# Patient Record
Sex: Female | Born: 1970 | Race: Black or African American | Hispanic: No | Smoking: Current every day smoker
Health system: Southern US, Community
[De-identification: ages and names within clinical notes are randomized; demographics above are authoritative.]

## PROBLEM LIST (undated history)

## (undated) DIAGNOSIS — J302 Other seasonal allergic rhinitis: Secondary | ICD-10-CM

## (undated) DIAGNOSIS — G43909 Migraine, unspecified, not intractable, without status migrainosus: Secondary | ICD-10-CM

## (undated) DIAGNOSIS — K219 Gastro-esophageal reflux disease without esophagitis: Secondary | ICD-10-CM

## (undated) DIAGNOSIS — D649 Anemia, unspecified: Secondary | ICD-10-CM

## (undated) HISTORY — PX: TONSILLECTOMY: SUR1361

## (undated) HISTORY — PX: DILATION AND CURETTAGE OF UTERUS: SHX78

## (undated) HISTORY — PX: CHOLECYSTECTOMY: SHX55

## (undated) HISTORY — DX: Migraine, unspecified, not intractable, without status migrainosus: G43.909

## (undated) HISTORY — PX: TUBAL LIGATION: SHX77

## (undated) HISTORY — PX: WISDOM TOOTH EXTRACTION: SHX21

---

## 2001-10-28 ENCOUNTER — Emergency Department (HOSPITAL_COMMUNITY): Admission: EM | Admit: 2001-10-28 | Discharge: 2001-10-28 | Payer: Self-pay | Admitting: Emergency Medicine

## 2002-07-23 ENCOUNTER — Other Ambulatory Visit: Admission: RE | Admit: 2002-07-23 | Discharge: 2002-07-23 | Payer: Self-pay | Admitting: Family Medicine

## 2002-11-26 ENCOUNTER — Inpatient Hospital Stay (HOSPITAL_COMMUNITY): Admission: AD | Admit: 2002-11-26 | Discharge: 2002-11-26 | Payer: Self-pay | Admitting: Obstetrics and Gynecology

## 2003-03-20 ENCOUNTER — Inpatient Hospital Stay (HOSPITAL_COMMUNITY): Admission: AD | Admit: 2003-03-20 | Discharge: 2003-03-22 | Payer: Self-pay | Admitting: Obstetrics & Gynecology

## 2003-05-15 ENCOUNTER — Encounter: Admission: RE | Admit: 2003-05-15 | Discharge: 2003-05-15 | Payer: Self-pay | Admitting: Family Medicine

## 2003-05-29 ENCOUNTER — Encounter: Admission: RE | Admit: 2003-05-29 | Discharge: 2003-05-29 | Payer: Self-pay | Admitting: Family Medicine

## 2003-07-07 ENCOUNTER — Encounter: Admission: RE | Admit: 2003-07-07 | Discharge: 2003-07-07 | Payer: Self-pay | Admitting: Family Medicine

## 2003-07-16 ENCOUNTER — Ambulatory Visit (HOSPITAL_COMMUNITY): Admission: RE | Admit: 2003-07-16 | Discharge: 2003-07-16 | Payer: Self-pay | Admitting: Obstetrics & Gynecology

## 2004-03-15 ENCOUNTER — Emergency Department (HOSPITAL_COMMUNITY): Admission: EM | Admit: 2004-03-15 | Discharge: 2004-03-15 | Payer: Self-pay | Admitting: Family Medicine

## 2004-04-26 ENCOUNTER — Encounter: Admission: RE | Admit: 2004-04-26 | Discharge: 2004-04-26 | Payer: Self-pay | Admitting: Family Medicine

## 2004-08-20 ENCOUNTER — Ambulatory Visit: Payer: Self-pay | Admitting: Psychiatry

## 2004-11-06 ENCOUNTER — Emergency Department (HOSPITAL_COMMUNITY): Admission: EM | Admit: 2004-11-06 | Discharge: 2004-11-06 | Payer: Self-pay | Admitting: Family Medicine

## 2005-10-06 ENCOUNTER — Emergency Department (HOSPITAL_COMMUNITY): Admission: EM | Admit: 2005-10-06 | Discharge: 2005-10-06 | Payer: Self-pay | Admitting: Emergency Medicine

## 2005-10-09 ENCOUNTER — Emergency Department (HOSPITAL_COMMUNITY): Admission: EM | Admit: 2005-10-09 | Discharge: 2005-10-09 | Payer: Self-pay | Admitting: Emergency Medicine

## 2006-06-20 ENCOUNTER — Emergency Department (HOSPITAL_COMMUNITY): Admission: EM | Admit: 2006-06-20 | Discharge: 2006-06-20 | Payer: Self-pay | Admitting: Family Medicine

## 2006-09-09 ENCOUNTER — Encounter (INDEPENDENT_AMBULATORY_CARE_PROVIDER_SITE_OTHER): Payer: Self-pay | Admitting: *Deleted

## 2006-09-09 LAB — CONVERTED CEMR LAB

## 2006-09-20 ENCOUNTER — Ambulatory Visit: Payer: Self-pay | Admitting: Family Medicine

## 2006-12-07 DIAGNOSIS — E669 Obesity, unspecified: Secondary | ICD-10-CM

## 2006-12-07 DIAGNOSIS — F17200 Nicotine dependence, unspecified, uncomplicated: Secondary | ICD-10-CM

## 2006-12-07 DIAGNOSIS — Z720 Tobacco use: Secondary | ICD-10-CM | POA: Insufficient documentation

## 2006-12-08 ENCOUNTER — Encounter (INDEPENDENT_AMBULATORY_CARE_PROVIDER_SITE_OTHER): Payer: Self-pay | Admitting: *Deleted

## 2007-06-18 ENCOUNTER — Encounter (INDEPENDENT_AMBULATORY_CARE_PROVIDER_SITE_OTHER): Payer: Self-pay | Admitting: *Deleted

## 2007-07-22 ENCOUNTER — Emergency Department (HOSPITAL_COMMUNITY): Admission: EM | Admit: 2007-07-22 | Discharge: 2007-07-22 | Payer: Self-pay | Admitting: Family Medicine

## 2007-09-03 ENCOUNTER — Emergency Department (HOSPITAL_COMMUNITY): Admission: EM | Admit: 2007-09-03 | Discharge: 2007-09-03 | Payer: Self-pay | Admitting: Family Medicine

## 2008-01-02 ENCOUNTER — Telehealth: Payer: Self-pay | Admitting: *Deleted

## 2008-01-03 ENCOUNTER — Encounter (INDEPENDENT_AMBULATORY_CARE_PROVIDER_SITE_OTHER): Payer: Self-pay | Admitting: Family Medicine

## 2008-01-03 ENCOUNTER — Encounter: Payer: Self-pay | Admitting: Family Medicine

## 2008-01-03 ENCOUNTER — Ambulatory Visit: Payer: Self-pay | Admitting: Family Medicine

## 2008-01-03 LAB — CONVERTED CEMR LAB
Chlamydia, DNA Probe: NEGATIVE
GC Probe Amp, Genital: NEGATIVE
HCV Ab: NEGATIVE
Hep A IgM: NEGATIVE
Hep B C IgM: NEGATIVE
Hepatitis B Surface Ag: NEGATIVE
Pap Smear: NORMAL

## 2008-01-07 ENCOUNTER — Encounter: Payer: Self-pay | Admitting: Family Medicine

## 2008-01-07 ENCOUNTER — Encounter (INDEPENDENT_AMBULATORY_CARE_PROVIDER_SITE_OTHER): Payer: Self-pay | Admitting: Family Medicine

## 2008-04-08 ENCOUNTER — Encounter: Admission: RE | Admit: 2008-04-08 | Discharge: 2008-04-08 | Payer: Self-pay | Admitting: Family Medicine

## 2008-07-21 ENCOUNTER — Emergency Department (HOSPITAL_COMMUNITY): Admission: EM | Admit: 2008-07-21 | Discharge: 2008-07-21 | Payer: Self-pay | Admitting: Emergency Medicine

## 2009-01-21 ENCOUNTER — Ambulatory Visit: Payer: Self-pay | Admitting: Family Medicine

## 2009-01-21 ENCOUNTER — Telehealth: Payer: Self-pay | Admitting: Family Medicine

## 2009-01-21 DIAGNOSIS — J301 Allergic rhinitis due to pollen: Secondary | ICD-10-CM

## 2009-04-21 ENCOUNTER — Encounter: Payer: Self-pay | Admitting: Family Medicine

## 2010-03-15 ENCOUNTER — Encounter: Payer: Self-pay | Admitting: *Deleted

## 2010-03-26 ENCOUNTER — Encounter: Payer: Self-pay | Admitting: Family Medicine

## 2010-03-28 ENCOUNTER — Emergency Department (HOSPITAL_COMMUNITY): Admission: EM | Admit: 2010-03-28 | Discharge: 2010-03-28 | Payer: Self-pay | Admitting: Emergency Medicine

## 2010-03-29 ENCOUNTER — Ambulatory Visit: Payer: Self-pay | Admitting: Family Medicine

## 2010-09-15 ENCOUNTER — Ambulatory Visit: Payer: Self-pay | Admitting: Family Medicine

## 2010-09-15 DIAGNOSIS — A5901 Trichomonal vulvovaginitis: Secondary | ICD-10-CM

## 2010-09-15 DIAGNOSIS — N898 Other specified noninflammatory disorders of vagina: Secondary | ICD-10-CM | POA: Insufficient documentation

## 2010-09-15 DIAGNOSIS — N760 Acute vaginitis: Secondary | ICD-10-CM | POA: Insufficient documentation

## 2010-09-15 DIAGNOSIS — B373 Candidiasis of vulva and vagina: Secondary | ICD-10-CM

## 2010-09-15 LAB — CONVERTED CEMR LAB: Whiff Test: POSITIVE

## 2010-10-26 ENCOUNTER — Ambulatory Visit
Admission: RE | Admit: 2010-10-26 | Discharge: 2010-10-26 | Payer: Self-pay | Source: Home / Self Care | Attending: Family Medicine | Admitting: Family Medicine

## 2010-10-26 ENCOUNTER — Encounter: Payer: Self-pay | Admitting: Family Medicine

## 2010-10-26 DIAGNOSIS — G43909 Migraine, unspecified, not intractable, without status migrainosus: Secondary | ICD-10-CM | POA: Insufficient documentation

## 2010-11-09 NOTE — Assessment & Plan Note (Signed)
Summary: sinus problem,tcb   Vital Signs:  Patient profile:   40 year old female Weight:      261 pounds Temp:     98.0 degrees F oral Pulse rate:   88 / minute BP sitting:   107 / 71  (left arm) Cuff size:   regular  Vitals Entered By: Tessie Fass CMA (March 29, 2010 3:43 PM) CC: sinus infection Is Patient Diabetic? No Pain Assessment Patient in pain? no        Primary Care Provider:  Lequita Asal  MD  CC:  sinus infection.  History of Present Illness: Really sick last week, not getting better.  Moslty sinus congestion and coughing most of the night.  Smokes 1 ppd for 20 years.  Does not want to miss work.  Habits & Providers  Alcohol-Tobacco-Diet     Tobacco Status: current     Tobacco Counseling: to quit use of tobacco products     Cigarette Packs/Day: 1.0  Current Medications (verified): 1)  Doxycycline Hyclate 100 Mg Caps (Doxycycline Hyclate) .... One Tab Two Times A Day For One Week 2)  Hydromet 5-1.5 Mg/46ml Syrp (Hydrocodone-Homatropine) .... One Teaspoonful Three Times A Day As Needed Cough, 120 Cc  Allergies (verified): No Known Drug Allergies  Review of Systems General:  Denies fever. ENT:  Complains of earache, nasal congestion, postnasal drainage, and sinus pressure. Resp:  Complains of cough, sputum productive, and wheezing.  Physical Exam  General:  Obese, congested Ears:  red TMs, right more retracted than left Nose:  swollen red turbs, moving little air through nares Mouth:  small oral pharynx, swollen tissue, + thick pnd Lungs:  course rhonchi, frequent cough Heart:  normal rate and regular rhythm.     Impression & Recommendations:  Problem # 1:  SINUSITIS, ACUTE (ICD-461.9)  Her updated medication list for this problem includes:    Doxycycline Hyclate 100 Mg Caps (Doxycycline hyclate) ..... One tab two times a day for one week    Hydromet 5-1.5 Mg/35ml Syrp (Hydrocodone-homatropine) ..... One teaspoonful three times a day as  needed cough, 120 cc  Orders: [facility]- Est Level  3 (95621)  Problem # 2:  TOBACCO DEPENDENCE (ICD-305.1) discussed risks of smoking and role in chronic sinus problems  Complete Medication List: 1)  Doxycycline Hyclate 100 Mg Caps (Doxycycline hyclate) .... One tab two times a day for one week 2)  Hydromet 5-1.5 Mg/64ml Syrp (Hydrocodone-homatropine) .... One teaspoonful three times a day as needed cough, 120 cc  Patient Instructions: 1)  Tobacco is very bad for your health and your loved ones ! You should stop smoking !  2)  Stop smoking tips: Choose a quit date. Cut down before the quit date. Decide what you will do as a substitute when you feel the urge to smoke(gum, toothpick, exercise).  Prescriptions: HYDROMET 5-1.5 MG/5ML SYRP (HYDROCODONE-HOMATROPINE) one teaspoonful three times a day as needed cough, 120 cc Brand medically necessary #1 x 0   Entered and Authorized by:   Luretha Murphy NP   Signed by:   Luretha Murphy NP on 03/29/2010   Method used:   Print then Give to Patient   RxID:   [id number] DOXYCYCLINE HYCLATE 100 MG CAPS (DOXYCYCLINE HYCLATE) one tab two times a day for one week Brand medically necessary #14 x 0   Entered and Authorized by:   Luretha Murphy NP   Signed by:   Luretha Murphy NP on 03/29/2010   Method used:   Print then Give  to Patient   RxID:   [id number]

## 2010-11-09 NOTE — Assessment & Plan Note (Signed)
Summary: yeast infection/eo   Vital Signs:  Patient profile:   40 year old female Weight:      260 pounds Temp:     98.4 degrees F oral Pulse rate:   87 / minute Pulse rhythm:   regular BP sitting:   125 / 95  (left arm) Cuff size:   regular  Vitals Entered By: Loralee Pacas CMA (September 15, 2010 8:39 AM) CC: yeast inf   Primary Care Provider:  Lequita Asal  MD  CC:  yeast inf.  History of Present Illness: 1. ? yeast infection: - Pt presents as a work in with complaints of vaginal itching and discharge and is concerned that she may have a yeast infection - She has had a yeast infection before many years ago and this does feel similar to that - She is only have mild vaginal discharge.  It is described as a white discharge, without a foul smell - She has had this for about 1 week.  It got a little better and then got worse. - She hasn't been taking anything for this  ROS: denies vaginal bleeding, dysuria, urinary frequency, abdominal pain, back pain, fevers, chills  SocHx: Is not sexually active.  Last intercourse was back in October.  Current Medications (verified): 1)  Hydromet 5-1.5 Mg/18ml Syrp (Hydrocodone-Homatropine) .... One Teaspoonful Three Times A Day As Needed Cough, 120 Cc 2)  Metronidazole 500 Mg Tabs (Metronidazole) .Marland Kitchen.. 1 Tab By Mouth Twice A Day For 7 Days 3)  Diflucan 150 Mg Tabs (Fluconazole) .... Take 1 Tab By Mouth X 1  Allergies (verified): No Known Drug Allergies  Past History:  Past Medical History: Reviewed history from 12/07/2006 and no changes required. trichomoniasis 09/2006  Social History: Reviewed history from 12/07/2006 and no changes required. Separated from husband. Has two daughters.  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  No illicits.  Smokes 1 ppd.  Physical Exam  General:  Vitals reviewed.  Comfortable appeaing.  no acute distress Mouth:  no oral lesions Neck:  supple, full ROM, and no masses.   Lungs:  normal  respiratory effort.   Heart:  normal rate and regular rhythm.   Abdomen:  soft, non-tender, normal bowel sounds, no distention, no masses, no guarding, no rigidity, and no rebound tenderness.   Genitalia:  minimal white vaginal discharge.  normal introitus, no external lesions, no vaginal or cervical lesions, no vaginal atrophy, no friaility or hemorrhage, and no adnexal masses or tenderness.   Msk:  no joint tenderness and no joint swelling.   Skin:  no rashes and no suspicious lesions.   Psych:  not anxious appearing and not depressed appearing.     Impression & Recommendations:  Problem # 1:  TRICHOMONAL VULVOVAGINITIS (ICD-131.01) Assessment New  Treat with Flagyl  Orders: [facility]- Est  Level 4 ([address])  Problem # 2:  BACTERIAL VAGINITIS (ICD-616.10) Assessment: New  Treat with Flagyl The following medications were removed from the medication list:    Doxycycline Hyclate 100 Mg Caps (Doxycycline hyclate) ..... One tab two times a day for one week Her updated medication list for this problem includes:    Metronidazole 500 Mg Tabs (Metronidazole) .Marland Kitchen... 1 tab by mouth twice a day for 7 days  Orders: [facility]- Est  Level 4 ([address])  Problem # 3:  CANDIDIASIS, VAGINAL (ICD-112.1) Assessment: New  Treat with Diflucan Her updated medication list for this problem includes:    Diflucan 150 Mg Tabs (Fluconazole) .Marland Kitchen... Take 1  tab by mouth x 1  Orders: [facility]- Est  Level 4 ([address])  Complete Medication List: 1)  Hydromet 5-1.5 Mg/20ml Syrp (Hydrocodone-homatropine) .... One teaspoonful three times a day as needed cough, 120 cc 2)  Metronidazole 500 Mg Tabs (Metronidazole) .Marland Kitchen.. 1 tab by mouth twice a day for 7 days 3)  Diflucan 150 Mg Tabs (Fluconazole) .... Take 1 tab by mouth x 1  [facility]  Patient Instructions: 1)  You have a couple different infections 2)  You have BV, Trich, and a yeast infection 3)  I am going to treat you for all of them 4)  I have sent  in prescriptions to your pharmacy 5)  If not better in 7-10 days please return to clinic Prescriptions: DIFLUCAN 150 MG TABS (FLUCONAZOLE) Take 1 tab by mouth x 1  #1 x 0   Entered and Authorized by:   Angelena Sole MD   Signed by:   Angelena Sole MD on 09/15/2010   Method used:   Electronically to        [facility] [id number] (retail)       [address]       [place], [place]  [address]       Ph: [phone]       Fax: [phone]   RxID:   [id number] METRONIDAZOLE 500 MG TABS (METRONIDAZOLE) 1 tab by mouth twice a day for 7 days  #14 x 0   Entered and Authorized by:   Angelena Sole MD   Signed by:   Angelena Sole MD on 09/15/2010   Method used:   Electronically to        [facility] [address] (retail)       [address]       [place], [place]  [address]       Ph: [phone]       Fax: [phone]   RxID:   [id number]    Orders Added: 1)  Wet Prep- [facility] [20254] 2)  [facility]- Est  Level 4 [[address]]    Laboratory Results  Date/Time Received: September 15, 2010 9:00 AM  Date/Time Reported: September 15, 2010 9:12 AM   Wet [place] Source: vag WBC/hpf: 5-10 Bacteria/hpf: 3+ rods and cocci Clue cells/hpf: moderate  Positive whiff Yeast/hpf: occ Trichomonas/hpf: many Comments: ...............test performed by......Marland KitchenBonnie A. [place], MLS (ASCP)cm

## 2010-11-09 NOTE — Letter (Signed)
Summary: Generic Letter  [facility]  [address]   [place], [place] [address]   Phone: [phone]  Fax: [phone]    03/15/2010  [name] [address] [place], [place]  [address]  Dear [name],   this letter is to inform you of the appointment that has been made for Erika Mckenzie for March 24, 2010 @ 1015am with Dr. Pollyann Kennedy at [facility] [address] phone [phone].  If you cannot keep this appointment please give thier office a 24 hour advanced notice.        Sincerely,   Loralee Pacas CMA

## 2010-11-09 NOTE — Miscellaneous (Signed)
  Clinical Lists Changes  Problems: Removed problem of SEXUALLY TRANSMITTED DISEASE, EXPOSURE TO (ICD-V01.6) Removed problem of GYNECOLOGICAL EXAMINATIONOUTINE (ICD-V72.31) Removed problem of SCREENING FOR MALIGNANT NEOPLASM(ICD-V76.2) Medications: Removed medication of GFN 1200/DM 60 1200-60 MG XR12H-TAB (DEXTROMETHORPHAN-GUAIFENESIN) one tablet by mouth two times a day as needed for cough

## 2010-11-11 NOTE — Letter (Signed)
Summary: Out of Work  [facility]  [address]   [place], [place] [address]   Phone: [phone]  Fax: [phone]    October 26, 2010   Employee:  Blondie C Kinsel    To Whom It May Concern:   For Medical reasons, please excuse the above named employee from work for the following dates:  Start:   10/26/2010  End:   10/26/2010  If you need additional information, please feel free to contact our office.         Sincerely,    Majel Homer MD

## 2010-11-11 NOTE — Assessment & Plan Note (Signed)
Summary: sinus inf?,df   Vital Signs:  Patient profile:   40 year old female Weight:      265.7 pounds Temp:     98.4 degrees F oral Pulse rate:   76 / minute BP sitting:   123 / 80  (left arm) Cuff size:   large  Vitals Entered By: Garen Grams LPN (October 26, 2010 10:05 AM) CC: ? sinus infection x 5 days, Headache Is Patient Diabetic? No Pain Assessment Patient in pain? yes     Location: head/ears   Primary Provider:  . [facility]  CC:  ? sinus infection x 5 days and Headache.  History of Present Illness: Pt presents with headache of 4 days duration.  Pain is bilateral forehead, persistent, sharp, and exacerbated by touch or pressure.  It is severe enough to interfear with her sleep and is accompanied by photo and phonophobia.  She is reducing her activity and has had to miss work due to it.  Pt reports waking up with the headache 4 days ago and she says it has gradually worsened since then.  She has had nausea and vomiting one time, no diarrhea.  no nasal drainage or changes in vision or hearing.  She does report that she typically has problems with headaches associated with her periods but that they are never this bad.  She had tried tylenol, excederine and goody powder all to no effect.  Preventive Screening-Counseling & Management  Alcohol-Tobacco     Smoking Status: current     Smoking Cessation Counseling: YES     Packs/Day: 1.0     Tobacco Counseling: to quit use of tobacco products  Allergies: No Known Drug Allergies  Past History:  Past medical, surgical, family and social histories (including risk factors) reviewed for relevance to current acute and chronic problems.  Past Medical History: Reviewed history from 12/07/2006 and no changes required. trichomoniasis 09/2006  Past Surgical History: Reviewed history from 12/07/2006 and no changes required. Lipid panel: TC=145, TG=98, HDL=58,LDL=67 - 09/21/2006  Family History: Reviewed history from  12/07/2006 and no changes required. father - ???, mother - DM, younger sister - healthy  Social History: Reviewed history from 12/07/2006 and no changes required. Separated from husband. Has two daughters.  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  No illicits.  Smokes 1 ppd.  Review of Systems       The patient complains of anorexia and headaches.  The patient denies fever, weight loss, weight gain, vision loss, decreased hearing, hoarseness, chest pain, syncope, dyspnea on exertion, peripheral edema, prolonged cough, hemoptysis, abdominal pain, melena, hematochezia, severe indigestion/heartburn, hematuria, transient blindness, and difficulty walking.    Physical Exam  General:  Vitals reviewed.  Comfortable appeaing.  no acute distress Head:  normocephalic and atraumatic.  Pain on palpation in preauricular portion of face and forehead bilaterally.  No tenderness over maxillary sinuses. Eyes:  vision grossly intact, pupils equal, pupils round, pupils reactive to light, and no injection.   Ears:  R ear normal and L ear normal.   Nose:  no external deformity, no external erythema, and no nasal discharge.   Mouth:  good dentition, pharynx pink and moist, no erythema, and no exudates.   Lungs:  normal respiratory effort.  CTABL Heart:  normal rate and regular rhythm.   Abdomen:  soft, non-tender, and normal bowel sounds.   Extremities:  No extremity swelling   Impression & Recommendations:  Problem # 1:  MIGRAINE HEADACHE (ICD-346.90) Pt has  what would appear to be menstrual associated migraines and now has a non-associated migraine.  Has not received any treatment for migraines in the past.  Toradol injection (30mg ) now with a compazine prescription and instruction to take benadryl throughout the day.  Come back tomorrow if not feeling better.  Will likely need to try immitrex at that point.  Orders: Ketorolac-Toradol 15mg  (Z6109) [facility]- Est Level  3 ([address])  Complete Medication  List: 1)  Prochlorperazine Maleate 10 Mg Tabs (Prochlorperazine maleate) .... Take one tablet every six hours for your headache   Patient Instructions: 1)  It was great to see you today.  I am very sorry to hear that you have such a bad headache. 2)  I will send in a prescription for Compazine.  You will want to take that (10mg  every six hours) and benadryl (also every six hours) for the rest of the day and try to get some sleep. 3)  If you are not feeling better tomorrow, come back in to see [place] tomorrow and we will try some other medicines. Prescriptions: PROCHLORPERAZINE MALEATE 10 MG TABS (PROCHLORPERAZINE MALEATE) Take one tablet every six hours for your headache  #30 x 0   Entered and Authorized by:   Majel Homer MD   Signed by:   Majel Homer MD on 10/26/2010   Method used:   Electronically to        [facility] [address] (retail)       [address] [place], [place]  [address]       Ph: [phone]       Fax: [phone]   RxID:   [id number]    Medication Administration  Injection # 1:    Medication: Ketorolac-Toradol 15mg     Diagnosis: MIGRAINE HEADACHE (ICD-346.90)    Route: IM    Site: L deltoid    Exp Date: 01/09/2012    Lot #: [id number]    Mfr: [email or web address]    Comments: Patient recieved 30 mg of Toradol    Patient tolerated injection without complications    Given by: Garen Grams LPN (October 26, 2010 11:21 AM)  Orders Added: 1)  Ketorolac-Toradol 15mg  [J1885] 2)  [facility]- Est Level  3 [44034]     Medication Administration  Injection # 1:    Medication: Ketorolac-Toradol 15mg     Diagnosis: MIGRAINE HEADACHE (ICD-346.90)    Route: IM    Site: L deltoid    Exp Date: 01/09/2012    Lot #: [id number]    Mfr: Wockhardt    Comments: Patient recieved 30 mg of Toradol    Patient tolerated injection without complications    Given by: Garen Grams LPN (October 26, 2010 11:21 AM)  Orders Added: 1)  Ketorolac-Toradol 15mg  [J1885] 2)   [facility]- Est Level  3 [[address]]

## 2010-11-26 ENCOUNTER — Encounter: Payer: Self-pay | Admitting: *Deleted

## 2011-01-28 ENCOUNTER — Ambulatory Visit (INDEPENDENT_AMBULATORY_CARE_PROVIDER_SITE_OTHER): Payer: Self-pay | Admitting: Family Medicine

## 2011-01-28 ENCOUNTER — Telehealth: Payer: Self-pay | Admitting: Family Medicine

## 2011-01-28 VITALS — BP 117/80 | HR 118 | Temp 99.5°F | Ht 64.0 in | Wt 258.3 lb

## 2011-01-28 DIAGNOSIS — J111 Influenza due to unidentified influenza virus with other respiratory manifestations: Secondary | ICD-10-CM | POA: Insufficient documentation

## 2011-01-28 MED ORDER — BENZONATATE 200 MG PO CAPS: 200.0000 mg | ORAL_CAPSULE | Freq: Three times a day (TID) | ORAL | Status: AC | PRN

## 2011-01-28 MED ORDER — OSELTAMIVIR PHOSPHATE 75 MG PO CAPS: 75.0000 mg | ORAL_CAPSULE | Freq: Two times a day (BID) | ORAL | Status: AC

## 2011-01-28 NOTE — Progress Notes (Signed)
URI: Pt has not been feeling well since Monday but says she was not having body aches and pains and fevers and cough until Wednesday (yesterday). She is now feeling even worse and missed worked yesterday and today. She has a cough but no sore throat, no runny nose, no breathing problems, no currently sick contacts but has some kids that were sick last week. She has been using home tea with lemon and honey, and Nyquil. She is having fevers and chills.   ROs: neg except as noted in HPi.   PE: Gen: NAD, but pt does not appear to feel well.  HEENT: [place]/AT, PERRL, EOMI, TM's normal and canals normal bilaterally, minor swelling on nasal turbinates, no obstruction. Post-pharynx has minimal erythema on anterior pillars.  CV: tachycardia, no murmur Pulm: CTAB, pt does cough frequently, non-productive.

## 2011-01-28 NOTE — Patient Instructions (Signed)
I agree that you sound like you have the flu.  I have sent in a medicine for cough and the Tamilflu. You can also try Robitussin DM which is OTC for cough and congestion if you need it.  Drink lots of water and rest.

## 2011-01-28 NOTE — Telephone Encounter (Signed)
rx for tamiflu prescribed this morning is over $100, pt is uninsured, wants to know if something cheaper can be called in?

## 2011-01-28 NOTE — Assessment & Plan Note (Signed)
Pt has had body aches and fevers since yesterday. Plan to treat with Tamiflu and tessalon perrles for the cough.

## 2011-01-31 NOTE — Telephone Encounter (Signed)
Spoke with patient and informed.

## 2011-01-31 NOTE — Telephone Encounter (Signed)
No , this is the only approved anti-flu medicine. She will need to just rest and drink lots of fluids and warm herbal tea and wait for time to heal it. She could consider getting the flu shot when she gets better. I'm sorry there is nothing more to offer but it is a virus. Please let her know.  Hospital doctor

## 2011-02-25 NOTE — Op Note (Signed)
   NAME:  Erika Mckenzie, Erika Mckenzie                        ACCOUNT NO.:  [id number]   MEDICAL RECORD NO.:  [id number]                   PATIENT TYPE:  AMB   LOCATION:  [facility]                                  FACILITY:  [facility]   PHYSICIAN:  Kathreen Cosier, M.D.           DATE OF BIRTH:  1970/11/15   DATE OF PROCEDURE:  07/16/2003  DATE OF DISCHARGE:                                 OPERATIVE REPORT   PREOPERATIVE DIAGNOSIS:  Multiparity.   PROCEDURE:  Open laparoscopic tubal sterilization.   Under general anesthesia, patient in lithotomy position, abdomen, perineum,  and vagina prepped and draped, bladder emptied with a straight catheter.  The weighted speculum placed in the vagina, then the cervix was grasped with  a Hulka tenaculum.  In the umbilicus a transverse incision made and carried  down to the fascia.  The fascia was cleaned, grasped with two Kochers, and  the fascia and the peritoneum opened with the Mayo scissors after elevating  the abdominal wall.  The sleeve of the trocar was inserted  intraperitoneally.  Three liters of carbon dioxide infused  intraperitoneally.  Visualizing scope inserted.  The uterus, tubes, and  ovaries were normal.  The cautery probe inserted through the sleeve of the  scope, the right tube grasped 1 cm from the cornu and cauterized.  The tube  was cauterized in a total of four places, moving lateral to the first site  of cautery.  The procedure done in a similar fashion on the other side.  The  probe was removed, CO2 allowed to escape from the peritoneal cavity.  The  fascia closed with a running stitch of 0 Dexon and the skin closed with  subcuticular stitch of 3-0 Monocryl.  The patient tolerated the procedure  well, taken to the recovery room in good condition.                                               Kathreen Cosier, M.D.    BAM/MEDQ  D:  07/16/2003  T:  07/16/2003  Job:  [id number]

## 2011-04-26 ENCOUNTER — Encounter: Payer: Self-pay | Admitting: Family Medicine

## 2011-04-26 ENCOUNTER — Ambulatory Visit (INDEPENDENT_AMBULATORY_CARE_PROVIDER_SITE_OTHER): Payer: 59 | Admitting: Family Medicine

## 2011-04-26 VITALS — BP 124/90 | HR 70 | Temp 98.4°F | Ht 64.0 in | Wt 260.8 lb

## 2011-04-26 DIAGNOSIS — G43909 Migraine, unspecified, not intractable, without status migrainosus: Secondary | ICD-10-CM

## 2011-04-26 MED ORDER — NAPROXEN 500 MG PO TABS: 500.0000 mg | ORAL_TABLET | Freq: Two times a day (BID) | ORAL | Status: DC

## 2011-04-26 NOTE — Progress Notes (Signed)
  Subjective:    Patient ID: Erika Mckenzie, female    DOB: [birth year], 40 y.o.   MRN: [id number]  HPIHere for work in appt for migraine  Migraines:  Occurs 5-6 times per year.  Usually related to sometime around her menstrual cycle.  Lasts usually 1 week.  Says a doctor has given her a prescription before but ran out so takes Exedrin OTC 2 tablets 2-3 times per day during that week, then does not have to take medications during other times of the month.  Has never been on prophylaxis.  Cannot find name of medication in [employer] or Epic.  This occurrence has been past 2-3 days, consistent with previous migraines throbbing pain, frontal, with photophobia, phonophobia.  No aura.  No changes in vision.  Some nausea. No vomiting.  No numbness, tingling, weakness.  I have reviewed patient's  PMH, FH, and Social history and Medications as related to this visit.  Review of Systems See HPI    Objective:   Physical Exam GEN: Alert & Oriented, No acute distress CV:  Regular Rate & Rhythm, no murmur Respiratory:  Normal work of breathing, CTAB Abd:  + BS, soft, no tenderness to palpation Ext: no pre-tibial edema Neuro:  CN 2-12 grossly intact.  Reflexes 2+ bilaterally.  Fundoscopic exam normal.        Assessment & Plan:

## 2011-04-26 NOTE — Assessment & Plan Note (Addendum)
No red flags.  Given sumatriptan today in office, given prescription for naprosyn for pain control.  Given handout on lifestyle modification to reduce headaches.  Discussed options for reducing menstrual migraines- not a candidate for estrogen containing therapies and has no need for contraception (BTL).  Advised to pretreat with naprosyn 1-2 days prior to menses.  If worsens, may consider progestin only tx to reduce number of menses.

## 2011-04-26 NOTE — Patient Instructions (Signed)
Your got a medicine called sumatriptan (imitrex) here in the office.   Use naprosyn for your headache. Consider using naprosyn 1-2 days before your cycles to help prevent migraines, or right at start of signo f headache. See information on non-medicine things you can do to prevent migraines.  Make appointment for check-up (physical)  Migraine Headache A migraine is very bad pain on one or both sides of your head. The cause of a migraine is not always known. HOME CARE  Many medicines can help migraine pain or keep migraines from coming back. Your doctor can help you decide on a medicine or treatment program.   If you or your child gets a migraine, it may help to lie down in a dark, quiet room.   Keep a headache journal. This may help find out what is causing the headaches. For example, write down:   What you eat and drink.   How much sleep you get.   Any change to your diet or medicines.  MIGRAINE SYMPTOMS  Sometimes, an aura can occur before you get a migraine. An aura is a group of problems (symptoms) that can predict a migraine. These can include:   Seeing flashing lights, bright spots, or zig-zag lines.   Tunnel vision.   Trouble talking.   Feelings of numbness.   Muscle weakness.   A migraine includes one or more of the following problems:   Pain on one or both sides of the head.   Pain that feels like pounding or throbbing inside the head.   Pain that is bad enough to keep you from doing daily activities.   Feeling sick to your stomach (nauseous).   Throwing up (vomiting).   Pain with exposure to bright lights, loud noises, or activity.  MIGRAINE TRIGGERS A migraine can be "triggered" or caused by different things, such as:  Alcohol.   Smoking.    Stress.    Your period (female menstruation) may be related.     Aged cheeses.     Foods or drinks that contain nitrates, glutamate, aspartame, or tyramine.     Lack of sleep.   Chocolate.    Caffeine.     Hunger.    Medicines, such as nitroglycerine (used to treat chest pain), birth control pills, estrogen, and some blood pressure medicines.     DIAGNOSIS  A migraine headache is often diagnosed based on:   Symptoms.   Physical exam.  GET HELP RIGHT AWAY IF:  The medicine you or your child was given does not work.   The pain begins again.   The neck is stiff.   You or your child is having trouble seeing.   The muscles are weak or you or your child loses muscle control.   There are new, very bad symptoms.   You or your child loses balance.   You or your child has trouble walking.   You or your child feels faint or passes out.  MAKE SURE YOU:    Understand these instructions.   Will watch this condition.   Will get help right away if you or your child is not doing well or gets worse.  Document Released: 07/05/2008 Document Re-Released: 12/21/2009 [facility] Patient Information 2011 [place], [place].

## 2011-05-27 ENCOUNTER — Ambulatory Visit (INDEPENDENT_AMBULATORY_CARE_PROVIDER_SITE_OTHER): Payer: 59 | Admitting: Family Medicine

## 2011-05-27 ENCOUNTER — Encounter: Payer: Self-pay | Admitting: Family Medicine

## 2011-05-27 VITALS — BP 124/78 | HR 80 | Temp 98.1°F | Wt 259.0 lb

## 2011-05-27 DIAGNOSIS — M5380 Other specified dorsopathies, site unspecified: Secondary | ICD-10-CM

## 2011-05-27 DIAGNOSIS — M62830 Muscle spasm of back: Secondary | ICD-10-CM | POA: Insufficient documentation

## 2011-05-27 MED ORDER — CYCLOBENZAPRINE HCL 5 MG PO TABS: 5.0000 mg | ORAL_TABLET | Freq: Every evening | ORAL | Status: AC | PRN

## 2011-05-27 NOTE — Patient Instructions (Signed)
I think you have some muscle spasm I have sent a muscle relaxant to your pharmacy. Take it at night because it can make you drowsy.  Take aleve with food twice a day for 3 days. Use heat for 15 mins three times a day  Come back if you have worsening pain or weakness

## 2011-05-27 NOTE — Assessment & Plan Note (Signed)
No loss of ROM, no weakness.  Likely 2/2 overuse.  Will have her try flexeril and continue aleve for 3 days.  Asked her to RTC if worsening or any weakness.

## 2011-05-27 NOTE — Progress Notes (Signed)
  Subjective:    Patient ID: Erika Mckenzie, female    DOB: [birth year], 40 y.o.   MRN: [id number]  HPI Pt was sweeping yard waste after doing yard work on Tuesday of this week.  She felt fine, she did not have any injury or sudden pain.  The next morning she woke up feeling sore and her back and shoulders hurt.  She was able to go to work but felt even worse on Thursday and took the day off.  Aleve helped but this morning she only feels maybe 5% better overall.  She denies numbness, tingling, weakness.  Review of Systems Denies CP, SOB, HA, N/V/D, fever     Objective:   Physical Exam Vital signs reviewed General appearance - alert, well appearing, mild discomfort with movement and oriented to person, place, and time MSK- full ROM of motion in spine and shoulders bilaterally.  Tender diffusely across cervical muscles, deltoid, and paraspinus muscles in the thoracic area.  Full strength in arms and hands       Assessment & Plan:  Muscle spasm of back No loss of ROM, no weakness.  Likely 2/2 overuse.  Will have her try flexeril and continue aleve for 3 days.  Asked her to RTC if worsening or any weakness.

## 2011-07-19 LAB — POCT URINALYSIS DIP (DEVICE)
Bilirubin Urine: NEGATIVE
Glucose, UA: NEGATIVE
Ketones, ur: NEGATIVE
Nitrite: NEGATIVE
Operator id: 235561
Protein, ur: NEGATIVE
Specific Gravity, Urine: 1.02
Urobilinogen, UA: 0.2
pH: 6.5

## 2011-07-19 LAB — WET PREP, GENITAL
Trich, Wet Prep: NONE SEEN
Yeast Wet Prep HPF POC: NONE SEEN

## 2011-07-19 LAB — HEPATITIS B SURFACE ANTIGEN: Hepatitis B Surface Ag: NEGATIVE

## 2011-07-19 LAB — RPR: RPR Ser Ql: NONREACTIVE

## 2011-07-19 LAB — GC/CHLAMYDIA PROBE AMP, GENITAL
Chlamydia, DNA Probe: NEGATIVE
GC Probe Amp, Genital: NEGATIVE

## 2011-07-19 LAB — HIV ANTIBODY (ROUTINE TESTING W REFLEX): HIV: NONREACTIVE

## 2011-07-20 IMAGING — CR DG CHEST 2V
2 series · 2 of 2 positions shown · non-contrast
Comparison: 04/08/2008.

CLINICAL DATA: Cough with chest pain and weakness.

CHEST - 2 VIEW

[w chest pa]
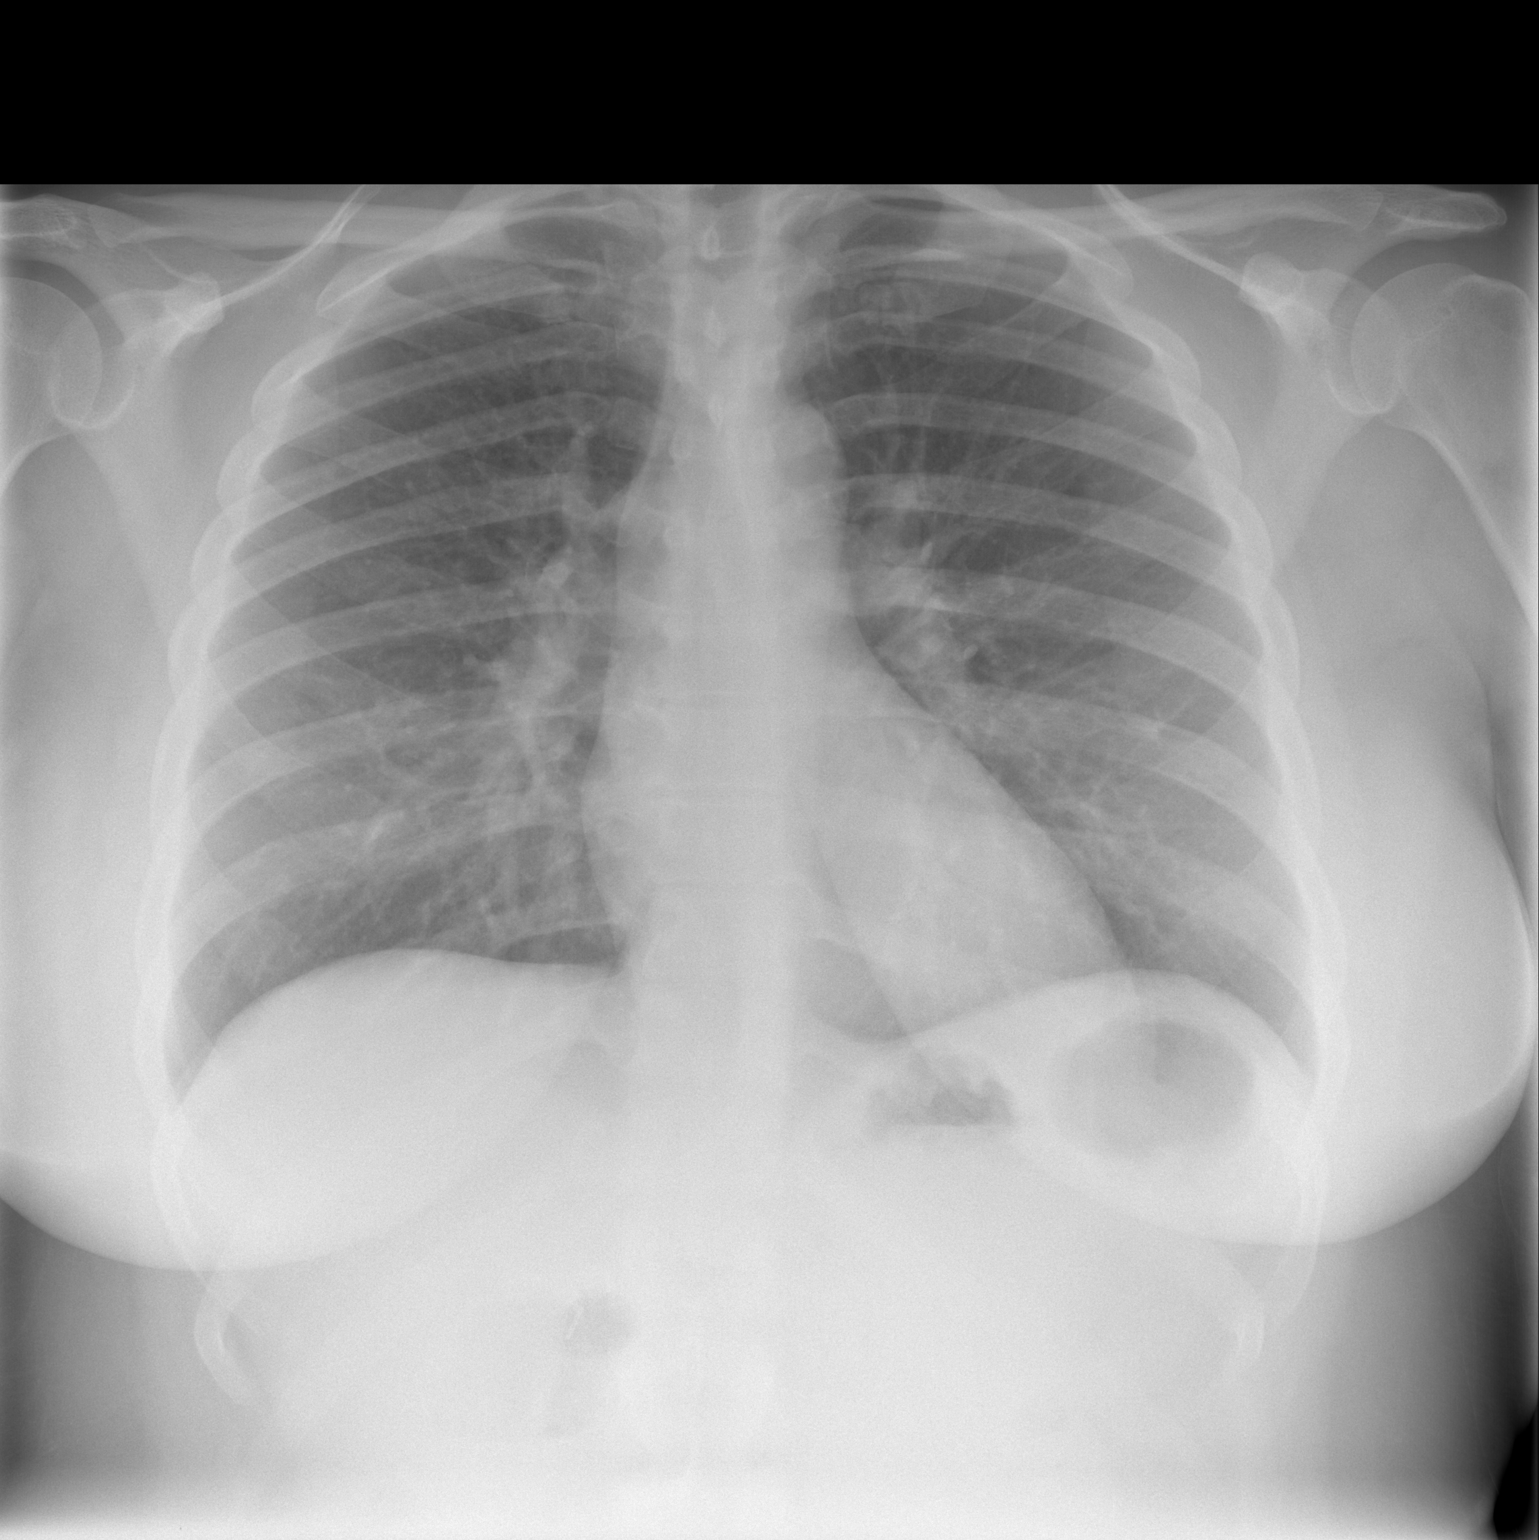

[w chest lat]
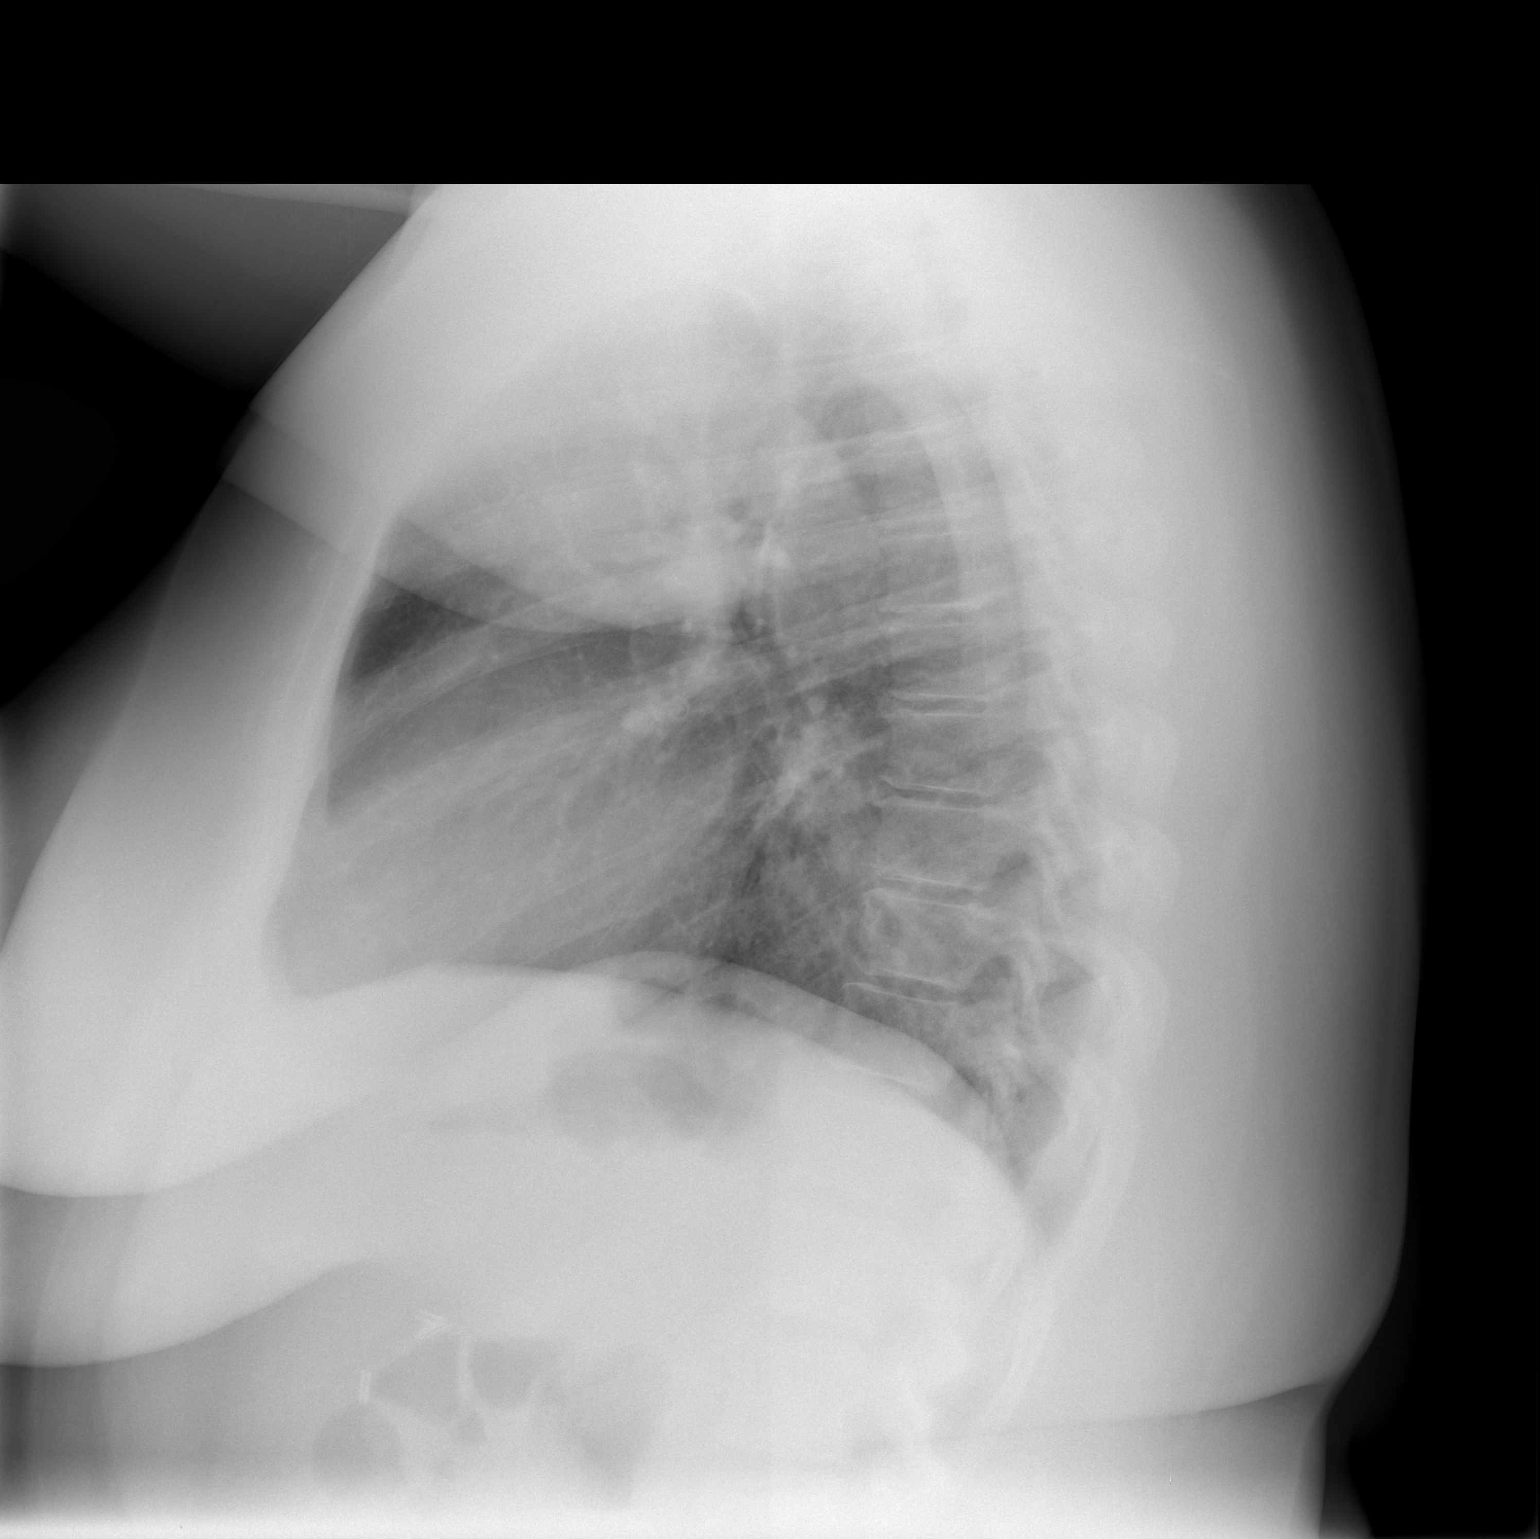

[2 of 2 positions shown; findings below may reference images not displayed]

FINDINGS: The heart size and mediastinal contours are stable.  The
lungs are clear.  There is no pleural effusion or pneumothorax.  No
acute osseous findings are identified.
IMPRESSION: Stable examination.  No active cardiopulmonary process.

## 2011-08-08 ENCOUNTER — Ambulatory Visit (INDEPENDENT_AMBULATORY_CARE_PROVIDER_SITE_OTHER): Payer: Managed Care, Other (non HMO) | Admitting: Family Medicine

## 2011-08-08 ENCOUNTER — Encounter: Payer: Self-pay | Admitting: Family Medicine

## 2011-08-08 VITALS — BP 134/83 | HR 80 | Temp 98.0°F | Ht 64.0 in | Wt 259.0 lb

## 2011-08-08 DIAGNOSIS — J301 Allergic rhinitis due to pollen: Secondary | ICD-10-CM

## 2011-08-08 DIAGNOSIS — Z23 Encounter for immunization: Secondary | ICD-10-CM

## 2011-08-08 DIAGNOSIS — F17200 Nicotine dependence, unspecified, uncomplicated: Secondary | ICD-10-CM

## 2011-08-08 MED ORDER — BENZONATATE 100 MG PO CAPS: 100.0000 mg | ORAL_CAPSULE | Freq: Three times a day (TID) | ORAL | Status: AC | PRN

## 2011-08-08 MED ORDER — CETIRIZINE HCL 10 MG PO TABS: 10.0000 mg | ORAL_TABLET | Freq: Every day | ORAL | Status: DC

## 2011-08-08 NOTE — Assessment & Plan Note (Signed)
Itchy throat and postnasal drip suggestive of seasonal allergies. We'll have her start Zyrtec now. Will give Tessalon Perles for cough as she's been on this before with good relief.

## 2011-08-08 NOTE — Assessment & Plan Note (Signed)
Patient states that she is ready to quit.she does not want to use the patch. I gave her information for the and see quit line and I asked her to go see Dr. Raymondo Band.

## 2011-08-08 NOTE — Progress Notes (Signed)
  Subjective:    Patient ID: Erika Mckenzie, female    DOB: 11-Jul-1971, 40 y.o.   MRN: [id number]  HPI  Cough x3 days. She describes her throat is very itchy and she feels like she has postnasal drip. No fevers. She has had sick contacts with her niece and nephew that had a short cold. She's been diagnosed with seasonal allergies the past, but she's not sure she really has these.  Tobacco use-patient states that she thinks her smoking may have some do with her cough. She would like to quit but she has not had success with nicotine patch or with cold [place]. She smokes one pack per day. She used to smoke [place] but now she is changed camels. She smokes within 30 minutes of waking up. She does not wake up at night to smoke.  Review of Systems Denies CP, SOB, HA, N/V/D, fever     Objective:   Physical Exam  Vital signs reviewed General appearance - alert, well appearing, and in no distress and oriented to person, place, and time Eyes - pupils equal and reactive, extraocular eye movements intact, sclera anicteric Ears - bilateral TM's and external ear canals normal, right ear normal, left ear normal Nose - normal and patent, no erythema, discharge or polyps Chest - clear to auscultation, no wheezes, rales or rhonchi, symmetric air entry, no tachypnea, retractions or cyanosis Heart - normal rate, regular rhythm, normal S1, S2, no murmurs, rubs, clicks or gallops       Assessment & Plan:

## 2011-08-08 NOTE — Patient Instructions (Signed)
It is great that you want to quit smoking.  I think this will be excellent for your health. Please make an appointment with Dr. Raymondo Band. He is our pharmacist here and he does smoking cessation counseling.  For your cough, I would like you to start taking Zyrtec at night. I am prescribing Tessalon Perles today.

## 2011-08-10 ENCOUNTER — Telehealth: Payer: Self-pay | Admitting: Family Medicine

## 2011-08-10 NOTE — Telephone Encounter (Signed)
Area is reddened and warm to the touch.  States that it does not look infected.  Advised her that it is probably a localized reaction to the latex in the prefilled syringe.  Instructed her to place cool compresses to the area and take Benadryl when she gets home.  If not any better by tomorrow she should call us back.  Patient agreeable.

## 2011-08-10 NOTE — Telephone Encounter (Signed)
Erika Mckenzie had flu shot on Mon to lf arm.  Now having a lot of discomfort from swelling and hot feeling to site.

## 2011-11-14 ENCOUNTER — Ambulatory Visit (INDEPENDENT_AMBULATORY_CARE_PROVIDER_SITE_OTHER): Payer: Managed Care, Other (non HMO) | Admitting: Family Medicine

## 2011-11-14 ENCOUNTER — Encounter: Payer: Self-pay | Admitting: Family Medicine

## 2011-11-14 ENCOUNTER — Ambulatory Visit
Admission: RE | Admit: 2011-11-14 | Discharge: 2011-11-14 | Disposition: A | Discharge status: Home / Self Care | Payer: 59 | Source: Ambulatory Visit | Attending: Family Medicine | Admitting: Family Medicine

## 2011-11-14 VITALS — BP 120/78 | HR 84 | Temp 98.2°F | Ht 64.0 in | Wt 254.3 lb

## 2011-11-14 DIAGNOSIS — M62830 Muscle spasm of back: Secondary | ICD-10-CM

## 2011-11-14 DIAGNOSIS — M539 Dorsopathy, unspecified: Secondary | ICD-10-CM

## 2011-11-14 MED ORDER — METHOCARBAMOL 500 MG PO TABS: 1000.0000 mg | ORAL_TABLET | Freq: Four times a day (QID) | ORAL | Status: AC

## 2011-11-14 MED ORDER — TRAMADOL HCL 50 MG PO TABS: 50.0000 mg | ORAL_TABLET | Freq: Three times a day (TID) | ORAL | Status: AC | PRN

## 2011-11-14 NOTE — Assessment & Plan Note (Signed)
Recurrent muscle spasm of back. Small concern due to tenderness over spinous processes. Will check with L. spine x-ray. Given prescription for Robaxin and tramadol. Advised to go back to work and keep mobile but not to lift more than 10 pounds for the week. See back on Friday. At that time we'll discuss back exercises and core strengthening exercises. Patient would benefit from weight loss.

## 2011-11-14 NOTE — Progress Notes (Signed)
Subjective:    Erika Mckenzie is a 41 y.o. female who presents for evaluation of low back pain. The patient has had recurrent self limited episodes of low back pain in the past. Symptoms have been present for 5 days and are unchanged.  Onset was related to / precipitated by lifting a heavy object. The pain is located in the across the lower back and does not radiate. The pain is described as sharp, soreness and throbbing and occurs all day. She rates her pain as a 10 on a scale of 0-10. Symptoms are exacerbated by extension, flexion, lying down, sitting and standing. Symptoms are improved by nothing. She has also tried muscle relaxants and NSAIDs which provided no symptom relief. She has no other symptoms associated with the back pain. The patient has no "red flag" history indicative of complicated back pain.  The following portions of the patient's history were reviewed and updated as appropriate: allergies, current medications, past medical history and problem list.  Review of Systems Pertinent items are noted in HPI.    Objective:   Inspection and palpation: spinal tenderness noted at L4-6, antalgic gait. Muscle tone and ROM exam: muscle spasm noted bilateral above iliac crest, full range of motion with pain. Straight leg raise: equivocal at 80 degrees bilaterally. Neurological: Normal muscle strength and sensation.    Assessment:    Nonspecific acute low back pain    Plan:    Natural history and expected course discussed. Questions answered. Short (2-4 day) period of relative rest recommended until acute symptoms improve. NSAIDs per medication orders. OTC analgesics as needed. Muscle relaxants per medication orders.

## 2011-11-14 NOTE — Patient Instructions (Signed)
I am sending you for x-rays today. Please try the Robaxin 2 pills 4 times a day for muscle spasm. Continue Motrin for pain and try the tramadol as well. Tried to keep moving and not stay in bed or on the couch for the next several days as this will make the back pain worse Come back and see me on Friday for a recheck.

## 2011-11-18 ENCOUNTER — Ambulatory Visit: Payer: Managed Care, Other (non HMO) | Admitting: Family Medicine

## 2012-03-20 ENCOUNTER — Other Ambulatory Visit (HOSPITAL_COMMUNITY)
Admission: RE | Admit: 2012-03-20 | Discharge: 2012-03-20 | Disposition: A | Discharge status: Home / Self Care | Payer: 59 | Source: Ambulatory Visit | Attending: Family Medicine | Admitting: Family Medicine

## 2012-03-20 ENCOUNTER — Encounter: Payer: Self-pay | Admitting: Family Medicine

## 2012-03-20 ENCOUNTER — Ambulatory Visit (INDEPENDENT_AMBULATORY_CARE_PROVIDER_SITE_OTHER): Payer: 59 | Admitting: Family Medicine

## 2012-03-20 VITALS — BP 122/81 | HR 78 | Temp 98.4°F | Ht 64.0 in | Wt 260.0 lb

## 2012-03-20 DIAGNOSIS — N898 Other specified noninflammatory disorders of vagina: Secondary | ICD-10-CM

## 2012-03-20 DIAGNOSIS — Z01419 Encounter for gynecological examination (general) (routine) without abnormal findings: Secondary | ICD-10-CM | POA: Insufficient documentation

## 2012-03-20 DIAGNOSIS — G5600 Carpal tunnel syndrome, unspecified upper limb: Secondary | ICD-10-CM

## 2012-03-20 DIAGNOSIS — Z124 Encounter for screening for malignant neoplasm of cervix: Secondary | ICD-10-CM

## 2012-03-20 DIAGNOSIS — Z0000 Encounter for general adult medical examination without abnormal findings: Secondary | ICD-10-CM

## 2012-03-20 DIAGNOSIS — J301 Allergic rhinitis due to pollen: Secondary | ICD-10-CM

## 2012-03-20 LAB — POCT WET PREP (WET MOUNT)
Clue Cells Wet Prep Whiff POC: POSITIVE
WBC, Wet Prep HPF POC: <5

## 2012-03-20 LAB — COMPREHENSIVE METABOLIC PANEL
ALT: 14 U/L (ref 0–35)
BUN: 9 mg/dL (ref 6–23)
CO2: 26 mEq/L (ref 19–32)
Calcium: 8.7 mg/dL (ref 8.4–10.5)
Chloride: 107 mEq/L (ref 96–112)
Creat: 0.84 mg/dL (ref 0.50–1.10)
Glucose, Bld: 83 mg/dL (ref 70–99)
Potassium: 4 mEq/L (ref 3.5–5.3)
Sodium: 139 mEq/L (ref 135–145)
Total Bilirubin: 0.3 mg/dL (ref 0.3–1.2)
Total Protein: 6.8 g/dL (ref 6.0–8.3)

## 2012-03-20 LAB — LIPID PANEL
HDL: 53 mg/dL (ref >39)
Total CHOL/HDL Ratio: 2.8 Ratio
Triglycerides: 132 mg/dL (ref <150)

## 2012-03-20 LAB — TSH: TSH: 2.436 u[IU]/mL (ref 0.350–4.500)

## 2012-03-20 LAB — CBC
Hemoglobin: 12.3 g/dL (ref 12.0–15.0)
MCH: 27 pg (ref 26.0–34.0)
MCHC: 33 g/dL (ref 30.0–36.0)
MCV: 81.8 fL (ref 78.0–100.0)
Platelets: 189 10*3/uL (ref 150–400)
RDW: 15.6 % — ABNORMAL HIGH (ref 11.5–15.5)
WBC: 6.4 10*3/uL (ref 4.0–10.5)

## 2012-03-20 MED ORDER — CETIRIZINE HCL 10 MG PO TABS: 10.0000 mg | ORAL_TABLET | Freq: Every day | ORAL | Status: DC

## 2012-03-20 NOTE — Patient Instructions (Signed)
i will call you if any of your labs are abnormal If they are normal I will send a letter  Please try the wrist braces at night and anytime you have the numbness I think you have some carpal tunnel symptoms Please let me know if this is not better with the braces  Please look up the [place] quitline on the internet Also, keep in mind that you can call for an appointment with Dr. Raymondo Band for help with smoking cessation

## 2012-03-21 ENCOUNTER — Telehealth: Payer: Self-pay | Admitting: Family Medicine

## 2012-03-21 ENCOUNTER — Encounter: Payer: Self-pay | Admitting: Family Medicine

## 2012-03-21 MED ORDER — METRONIDAZOLE 500 MG PO TABS: 500.0000 mg | ORAL_TABLET | Freq: Two times a day (BID) | ORAL | Status: AC

## 2012-03-21 NOTE — Assessment & Plan Note (Signed)
Discussed smoking cessation, pt highly motivated to quit in next 6 months due to insurance premiums going up.  Gave [place] quitline and Dr. Raymondo Band info.   Pap done today.

## 2012-03-21 NOTE — Assessment & Plan Note (Signed)
Pt not worried about STDs, so will only check wet prep

## 2012-03-21 NOTE — Telephone Encounter (Signed)
Called pt to let her know about BV.  Left VM.  Sent flagyl to pharmacy.

## 2012-03-21 NOTE — Progress Notes (Signed)
  Subjective:    Patient ID: Erika Mckenzie, female    DOB: Sep 15, 1971, 41 y.o.   MRN: [id number]  HPI  Hand numbness-patient is is the last 2 weeks she lifts her hand numbness at night. Her hands felt tingly and tired. She also notes that she will get hand numbness when she is driving or typing. No change in medicine. No change in activity.  Smoking-patient smokes one pack a day. She strongly wanted to quit now because her insurance premiums will go up in 6 months. Her insurance is sending her free medications for 6 months. She is also interested in other resources that we have.  Vaginal discharge-patient notes a occasional discharge with her periods. She says that she has a fishy odor with it. She's not had any new partners and is a monogamous relationship.  Migraine-patient notes migraines are better now with naproxen for the length of her period. She does not often get headaches any other time. She does not think that these headaches are bad enough to warrant any hormonal therapy. She is not have vision change, dizziness with these headaches.  Review of Systems Denies shortness of breath, chest pain. Denies nausea, vomiting, diarrhea.    Objective:   Physical Exam  Vital signs reviewed General appearance - alert, well appearing, and in no distress Heart - normal rate, regular rhythm, normal S1, S2, no murmurs, rubs, clicks or gallops Chest - clear to auscultation, no wheezes, rales or rhonchi, symmetric air entry, no tachypnea, retractions or cyanosis Neurological - alert, oriented, normal speech, no focal findings or movement disorder noted, neck supple without rigidity, cranial nerves II through XII intact Abdomen - soft, nontender, nondistended, no masses or organomegaly GYN- external genetalia normal, without lesions.  Vagina normal color, rugations, with thick, white discharge.  Cervix normal color without lesions or discharge. Extremities - peripheral pulses normal, no pedal edema,  no clubbing or cyanosis       Assessment & Plan:

## 2012-03-27 ENCOUNTER — Telehealth: Payer: Self-pay | Admitting: Family Medicine

## 2012-03-27 NOTE — Telephone Encounter (Signed)
Patient is calling back about the message she left this morning.

## 2012-03-27 NOTE — Telephone Encounter (Signed)
Patient is calling about the antibiotic being too strong and having vaginal irriation and is hoping for a different type of medication sent to her pharmacy.  [facility] [address].  Please call patient with decision.

## 2012-03-28 ENCOUNTER — Encounter: Payer: Self-pay | Admitting: *Deleted

## 2012-03-28 ENCOUNTER — Encounter: Payer: Self-pay | Admitting: Family Medicine

## 2012-03-28 NOTE — Telephone Encounter (Signed)
Patient is calling back to check the status of this change.

## 2012-03-28 NOTE — Telephone Encounter (Signed)
Patient notified of message from MD..  States she stopped the antibiotic 2 days ago.

## 2012-03-28 NOTE — Telephone Encounter (Signed)
Patient is calling back because now she has vaginal irritation since starting the oral antibiotiic . 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 Will send message to Dr. Hulen Luster and told patient I will call her back today.

## 2012-03-28 NOTE — Telephone Encounter (Signed)
Patient can stop the antibiotic if she thinks she is having a bad reaction. Antibiotics can give her yeast infection. If this irritation feels like a yeast infection, she can use an over-the-counter yeast medicine. If she is not sure what irritation is due to, she can come in and we can swab her again.

## 2012-03-30 NOTE — Telephone Encounter (Signed)
This encounter was created in error - please disregard.

## 2012-05-14 ENCOUNTER — Telehealth: Payer: Self-pay | Admitting: Family Medicine

## 2012-05-14 ENCOUNTER — Encounter: Payer: Self-pay | Admitting: Family Medicine

## 2012-05-14 ENCOUNTER — Ambulatory Visit (INDEPENDENT_AMBULATORY_CARE_PROVIDER_SITE_OTHER): Payer: 59 | Admitting: Family Medicine

## 2012-05-14 VITALS — BP 113/77 | HR 79 | Temp 98.4°F | Ht 64.0 in | Wt 256.0 lb

## 2012-05-14 DIAGNOSIS — G43909 Migraine, unspecified, not intractable, without status migrainosus: Secondary | ICD-10-CM

## 2012-05-14 DIAGNOSIS — R51 Headache: Secondary | ICD-10-CM

## 2012-05-14 MED ORDER — SUMATRIPTAN SUCCINATE 50 MG PO TABS: 50.0000 mg | ORAL_TABLET | ORAL | Status: DC | PRN

## 2012-05-14 MED ORDER — KETOROLAC TROMETHAMINE 30 MG/ML IJ SOLN
30.0000 mg | Freq: Once | INTRAMUSCULAR | Status: AC
  Administered 2012-05-14: 30 mg via INTRAMUSCULAR

## 2012-05-14 NOTE — Patient Instructions (Addendum)
We will give you a dose of Toradol in the clinic.   Take the Imitrex if you still have a headache.   Make an appointment in 1 month with your PCP to follow-up on your headaches and to prevent future recurrence.

## 2012-05-14 NOTE — Telephone Encounter (Signed)
Pt is having a really bad headache and wants to speak to nurse about what to do.

## 2012-05-14 NOTE — Assessment & Plan Note (Signed)
Acute migraine headache, typical for patient. No concerning signs.  Toradol shot today, Rx for Sumatriptan, continue Naproxen as needed.  Work-note given for today.

## 2012-05-14 NOTE — Telephone Encounter (Signed)
Patient has had headache since Sat  without relief. Appointment scheduled to come in now for work in.

## 2012-05-14 NOTE — Progress Notes (Signed)
  Subjective:    Patient ID: Erika Mckenzie, female    DOB: [birth year], 41 y.o.   MRN: [id number]  HPI Work-in: headache for 2 days It feels like a migraine: right-sided, throbbing, worsening headache Photophobia, no phonophobia or nausea/vomiting She ran out of sumatriptan last month. She says it was helping.  Naproxen 1-2 tablets a day is not helping  Not associated with periods. Last period 2 weeks ago.   She reports being well hydrated, sleeping well.  Caffeine: 1 cup of coffee daily; occasional [facility]  Review of Systems Denies vision changes or eye pain, fevers  Allergies, medication, past medical history reviewed.  Significant for: History of tobacco use--1ppd    Objective:   Physical Exam GEN: appears uncomfortable; well-nourished HEENT:   [place]/AT, mild TTP right forehead   Normal conjunctiva; no papilledema   TM clear bilaterally   No rhinorrhea, normal turbinates   MMM NEURO: grossly intact    Assessment & Plan:

## 2012-05-15 ENCOUNTER — Telehealth: Payer: Self-pay | Admitting: Family Medicine

## 2012-05-15 NOTE — Telephone Encounter (Signed)
Patient is calling because her insurance will only pay for 4 pills of Sumatriptan at a time and she was hoping for a Rx for something else.

## 2012-05-17 MED ORDER — SUMATRIPTAN SUCCINATE 50 MG PO TABS: 50.0000 mg | ORAL_TABLET | ORAL | Status: DC | PRN

## 2012-05-17 MED ORDER — IBUPROFEN 800 MG PO TABS: 800.0000 mg | ORAL_TABLET | Freq: Three times a day (TID) | ORAL | Status: AC | PRN

## 2012-05-17 NOTE — Telephone Encounter (Signed)
LMOVM for pt to call back.  Mathews Stuhr Dawn  

## 2012-05-17 NOTE — Telephone Encounter (Signed)
Reordered sumatriptan 4 tablets so patient can hopefully pick this up, ordered with 3 refills. Also ordered ibuprofen 800mg  q8hours prn that patient can try prior to sumatriptan, and use the sumatriptan if no benefit from ibuprofen.  If headache does not improve, patient can set up follow-up visit and we can consider starting a beta-blocker for migraine prevention.  Please call patient to inform of the above.  Thanks.

## 2012-05-17 NOTE — Telephone Encounter (Signed)
Pt informed. Erika Mckenzie  

## 2012-10-22 ENCOUNTER — Ambulatory Visit: Payer: 59

## 2012-10-25 ENCOUNTER — Other Ambulatory Visit: Payer: Self-pay | Admitting: Family Medicine

## 2012-11-26 ENCOUNTER — Telehealth: Payer: Self-pay | Admitting: Family Medicine

## 2012-11-26 MED ORDER — SUMATRIPTAN SUCCINATE 50 MG PO TABS: 50.0000 mg | ORAL_TABLET | ORAL | Status: DC | PRN

## 2012-11-26 NOTE — Telephone Encounter (Signed)
Will prescribe 4 tablets with 1 refill and instruct patient on sig not to take more than 200mg  in 24 hours. Pt has appt to see me 2/25.

## 2012-11-26 NOTE — Telephone Encounter (Signed)
Patient notified

## 2012-11-26 NOTE — Telephone Encounter (Signed)
Pt is having migraine and wants to know if she can have imitrex called in for her - an appt has been made to see her PCP next Tues 2/25 but is needing this now.  pls advise  [facility] [address]

## 2012-12-04 ENCOUNTER — Encounter: Payer: Self-pay | Admitting: Family Medicine

## 2012-12-04 ENCOUNTER — Ambulatory Visit (INDEPENDENT_AMBULATORY_CARE_PROVIDER_SITE_OTHER): Payer: 59 | Admitting: Family Medicine

## 2012-12-04 VITALS — BP 137/67 | HR 88 | Temp 98.9°F | Ht 64.0 in | Wt 260.0 lb

## 2012-12-04 DIAGNOSIS — G5600 Carpal tunnel syndrome, unspecified upper limb: Secondary | ICD-10-CM

## 2012-12-04 NOTE — Patient Instructions (Addendum)
It was good to meet you today!  For your migraines,  - 48 hours before your period starts, take 2 OTC naproxen twice a day for the next 4-5 days.  - Take sumatriptan 1 tablet if pain is unrelieved by naproxen.  Continue working on cutting back on cigarettes. Cut back on nicotine (check your e-cigarettes). Keep a headache diary to help [place] understand other triggers.  Follow up with me about your migraines in 2-3 months.  Follow up for other issues whenever works for your schedule. I'd like to further evaluate your nausea.

## 2012-12-04 NOTE — Progress Notes (Signed)
Subjective:     Patient ID: Erika Mckenzie, female   DOB: May 08, 1971, 42 y.o.   MRN: [id number]  Migraine follow up  HPI  Erika Mckenzie is a 42 y.o. female with h/o migraine headaches, obesity, tobacco use, and back muscle spasms here to follow-up her migraines. Pt reports her family wanted her to come in because they were worried about her. She reports migraines >1 year that come on monthly with her menstrual cycle, which is very heavy at times. Pain is 10/10 with nausea, hot and cold flashes, and usually remit in 2 hours if she takes sumatriptan. She uses 1 50mg  sumatriptan tablet monthly, occasionally needing 2. She also takes naproxen for headaches monthly. She denies neurologic symptoms. If she does not have medication on hand, she has to go to sleep to ease headache. She knows cigarettes contribute to her headaches and is trying to cut back, having switch to e cigarettes.  Pt also states she has a diagnosis of carpal tunnel syndrome and has bilateral hand numbness and aching on palmar surface after sleeping. She would like a smaller brace.   Review of Systems Per HPI. Also, decreased sexual urge and feeling like she may be about to start perimenopause and that this may be harming her relationship. Also, acid reflux symptoms with very occasional vomiting that seems random and unrelated to food. Denies CP, fever, or SOB.  PMH, SH, and FH reviewed with no changes. SH - Does work that requires good hand grip     Objective:   Physical Exam BP 137/67  Pulse 88  Temp(Src) 98.9 F (37.2 C) (Oral)  Ht 5\' 4"  (1.626 m)  Wt 260 lb (117.935 kg)  BMI 44.61 kg/m2 GEN: NAD, sitting on exam table PULM: Normal effort NEURO: Alert, awake, CN 2-12 tested and in tact, normal speech and gait, EOMI, PERRL    Assessment:     42 y.o. female with h/o migraine headaches, obesity, tobacco use, and back muscle spasms here to follow-up her migraines.    Plan:

## 2012-12-05 NOTE — Assessment & Plan Note (Signed)
Given that patient feels this is putting a strain on her relationship, follow-up at next visit. - Recommended [place] or other lubricant - Consider estrogen cream if dryness v exploring relationship or stressors in pt's life

## 2012-12-05 NOTE — Assessment & Plan Note (Addendum)
Occur once monthly at time of menses and respond well to naproxen and once monthly sumatriptan, with resolution in ~2 hours. No focal neurologic signs/symptoms. - 48 hours before period starts, take 2 OTC naproxen twice a day for the next 4-5 days.  - Take sumatriptan 1 tablet if pain is unrelieved by naproxen. Pt has rx for 50 mg #4 tabs with 1 refill and knows to call when only has 4 tabs left, which according to current used should last ~4-8 months. - Nausea is likely related to migraines; if abdominal pain or blood-tinged emesis, hold naproxen  - Continue decreasing smoking. For best results, cut back on nicotine slowly (check e-cigarettes for nicotine content). - Keep a headache diary to help [place] understand other triggers to avoid - No OCP due to age >=40 and smoking status with risk for DVT;  - Discussed propranolol prophylaxis but patient does not feel like she will remember to take daily medicine and feels ha's are well-controlled with once monthly sumatriptan - F/u 2-3 months

## 2012-12-05 NOTE — Assessment & Plan Note (Signed)
Carpal tunnel syndrome - seems to be significantly bothering patient especially due to need for full hand function/sensation at work - Sports Medicine consult ordered for evaluation of dx and recommendation of cock-up wrist brace

## 2012-12-12 ENCOUNTER — Ambulatory Visit: Payer: 59 | Admitting: Family Medicine

## 2012-12-25 ENCOUNTER — Ambulatory Visit: Payer: 59 | Admitting: Family Medicine

## 2013-01-08 ENCOUNTER — Ambulatory Visit (INDEPENDENT_AMBULATORY_CARE_PROVIDER_SITE_OTHER): Payer: 59 | Admitting: Family Medicine

## 2013-01-08 ENCOUNTER — Encounter: Payer: Self-pay | Admitting: Family Medicine

## 2013-01-08 VITALS — BP 136/86 | HR 69 | Ht 64.0 in | Wt 260.0 lb

## 2013-01-08 DIAGNOSIS — G5600 Carpal tunnel syndrome, unspecified upper limb: Secondary | ICD-10-CM

## 2013-01-08 NOTE — Progress Notes (Signed)
Subjective: Erika Mckenzie is a very pleasant 42 yo left hand dominant female presenting for evaluation of bilateral wrist pain.  Symptoms began approximately 4 months ago and have been getting progressively worse since that time.  Pain is 3/10 dull pain located on the volar wrist with numbness radiating into the ring fingers.  This is more pronounced on the left compared to the right.  Pain is worse with using her hand for daily activities including driving, eating, holding a coffee mug and also will awaken her from sleep.  Denies any fevers/chills, neck pain, shoulder pain, arm pain, and no apparent weakness in either hand.    Past Medical History  Diagnosis Date  . Migraines    Past Surgical History  Procedure Laterality Date  . Tubal ligation     No Known Allergies  History  Substance Use Topics  . Smoking status: Current Every Day Smoker -- 1.00 packs/day    Types: Cigarettes  . Smokeless tobacco: Not on file  . Alcohol Use: Not on file    Objective: BP 136/86  Pulse 69  Ht 5\' 4"  (1.626 m)  Wt 260 lb (117.935 kg)  BMI 44.61 kg/m2 Gen:  NAD, well appearing.  Skin:  Well perfused, warm and dry.  Psych:  Alert and oriented x 3.  Neuro:  Sensation intact to light touch and equal in the bilateral upper extremities.   Msk:  Inspection of neck and arms reveal no obvious deformity.  Tenderness to palpation over the transverse carpal ligament bilaterally (L>R).  Neck exhibits full passive and active ROM.  Elbow and wrist with full active and passive ROM.  Strength 5/5 in deltoid, biceps, triceps, wrist flexors/extensors, OK sign and hand intrinsics.  + Tinel's, + Phalen's, + Reverse Phalen's, - Spurling's.  Limited bilateral wrist ultrasound in transverse view revealed severely enlarged and hypoechogenic areas of the left median nerve measuring 0.9cm compared to 0.6 on the right.   Assessment: 42 yo left hand dominant female with 4 month history of bilateral wrist pain with associated  ring finger numbness with clinical and ultrasound evidence of irritation to the median nerve.   Plan: 1.  Bilateral carpal tunnel  --counseled and educated patient regarding her condition.  Discussed treatment options including conservative vs corticosteroid injection.  Patient deferred injection today and she was sent home with home exercise stretching regimen and bilateral cock up wrist splints to wear at night. I will plan to see her back in 6-8 weeks for reevaluation and consideration of corticosteroid injection if no improvement of symptoms.

## 2013-01-10 ENCOUNTER — Telehealth: Payer: Self-pay | Admitting: *Deleted

## 2013-01-10 MED ORDER — MELOXICAM 15 MG PO TABS: ORAL_TABLET | ORAL | Status: DC

## 2013-01-10 NOTE — Telephone Encounter (Signed)
Message copied by Mora Bellman on Thu Jan 10, 2013  8:45 AM ------      Message from: Judi Saa      Created: Wed Jan 09, 2013  5:42 PM       Send he rin meloxicam 15mg  daily for 1 week then PRN      #30 one refill      Thank you      ----- Message -----         From: Mora Bellman, RN         Sent: 01/09/2013   4:56 PM           To: Judi Saa, DO            Pt called requesting an anit inflammatory- she thought you were going to send one in for her.             ------

## 2013-02-19 ENCOUNTER — Ambulatory Visit: Payer: 59 | Admitting: Family Medicine

## 2013-05-06 ENCOUNTER — Encounter: Payer: Self-pay | Admitting: Family Medicine

## 2013-05-06 ENCOUNTER — Ambulatory Visit (INDEPENDENT_AMBULATORY_CARE_PROVIDER_SITE_OTHER): Payer: 59 | Admitting: Family Medicine

## 2013-05-06 VITALS — BP 110/72 | HR 82 | Temp 98.2°F | Ht 64.0 in | Wt 264.0 lb

## 2013-05-06 DIAGNOSIS — G43909 Migraine, unspecified, not intractable, without status migrainosus: Secondary | ICD-10-CM

## 2013-05-06 MED ORDER — KETOROLAC TROMETHAMINE 60 MG/2ML IM SOLN
60.0000 mg | Freq: Once | INTRAMUSCULAR | Status: AC
  Administered 2013-05-06: 60 mg via INTRAMUSCULAR

## 2013-05-06 MED ORDER — DEXAMETHASONE SODIUM PHOSPHATE 10 MG/ML IJ SOLN
10.0000 mg | Freq: Once | INTRAMUSCULAR | Status: AC
  Administered 2013-05-06: 10 mg via INTRAMUSCULAR

## 2013-05-06 NOTE — Addendum Note (Signed)
Addended by: Henri Medal on: 05/06/2013 05:05 PM   Modules accepted: Orders

## 2013-05-06 NOTE — Assessment & Plan Note (Signed)
No obvious sign of trigger 2 migraines in past 6 days.  Recommended migraine journal Decadron and toradol today in office for abortive therapy Cont Goodies PRN

## 2013-05-06 NOTE — Progress Notes (Signed)
Erika Mckenzie is a 42 y.o. female who presents to [facility] today for SD appt for migraine  Recurring migraines: seen by PCP in February and given imitrex Rx. No migraine since that time until 6 days ago.  Associated w/ photophobia, phonophobia. No nausea. Entire head. Pounding. 2 imitrex, ceterizine w/o much benefit.Rest and  Goodies later that day w/ relief. Another HA started today. Denies Szr, CP, SOB, palpitations, LOC. Took goodies powder w/ some benefit.   Tobacco: 1ppd. Trying E cig.   The following portions of the patient's history were reviewed and updated as appropriate: allergies, current medications, past medical history, family and social history, and problem list.  Patient is a smoker.  Past Medical History  Diagnosis Date  . Migraines     ROS as above otherwise neg.    Medications reviewed. Current Outpatient Prescriptions  Medication Sig Dispense Refill  . cetirizine (ZYRTEC) 10 MG tablet Take 1 tablet (10 mg total) by mouth daily.  30 tablet  11  . meloxicam (MOBIC) 15 MG tablet Take 1 tablet daily with food for 1 week, then take as needed.  30 tablet  1  . naproxen (NAPROSYN) 500 MG tablet Take 1 tablet (500 mg total) by mouth 2 (two) times daily with a meal.  30 tablet  1  . SUMAtriptan (IMITREX) 50 MG tablet Take 1 tablet (50 mg total) by mouth every 2 (two) hours as needed for migraine. No more than 200mg  (4 tablets) in 24 hours  4 tablet  1   No current facility-administered medications for this visit.    Exam:  BP 110/72  Pulse 82  Temp(Src) 98.2 F (36.8 C) (Oral)  Ht 5\' 4"  (1.626 m)  Wt 264 lb (119.75 kg)  BMI 45.29 kg/m2  LMP 04/13/2013 Gen: Well NAD HEENT: EOMI,  MMM Neuro: CN 2-12 intact, cerebellar fxn nml, proprioception nml    No results found for this or any previous visit (from the past 72 hour(s)).

## 2013-05-06 NOTE — Patient Instructions (Addendum)
You are having a migraine The medicine today in office should help you a lot.  Please continue taking all of your medications as prescribed. Please [place] Goodies as needed Please keep a migraine journal of your symptoms Please follow up with Dr. Karie Schwalbe. For further migraine management.  Migraine Headache A migraine headache is an intense, throbbing pain on one or both sides of your head. A migraine can last for 30 minutes to several hours. CAUSES  The exact cause of a migraine headache is not always known. However, a migraine may be caused when nerves in the brain become irritated and release chemicals that cause inflammation. This causes pain. SYMPTOMS  Pain on one or both sides of your head.  Pulsating or throbbing pain.  Severe pain that prevents daily activities.  Pain that is aggravated by any physical activity.  Nausea, vomiting, or both.  Dizziness.  Pain with exposure to bright lights, loud noises, or activity.  General sensitivity to bright lights, loud noises, or smells. Before you get a migraine, you may get warning signs that a migraine is coming (aura). An aura may include:  Seeing flashing lights.  Seeing bright spots, halos, or zig-zag lines.  Having tunnel vision or blurred vision.  Having feelings of numbness or tingling.  Having trouble talking.  Having muscle weakness. MIGRAINE TRIGGERS  Alcohol.  Smoking.  Stress.  Menstruation.  Aged cheeses.  Foods or drinks that contain nitrates, glutamate, aspartame, or tyramine.  Lack of sleep.  Chocolate.  Caffeine.  Hunger.  Physical exertion.  Fatigue.  Medicines used to treat chest pain (nitroglycerine), birth control pills, estrogen, and some blood pressure medicines. DIAGNOSIS  A migraine headache is often diagnosed based on:  Symptoms.  Physical examination.  A CT scan or MRI of your head. TREATMENT Medicines may be given for pain and nausea. Medicines can also be given to help  prevent recurrent migraines.  HOME CARE INSTRUCTIONS  Only take over-the-counter or prescription medicines for pain or discomfort as directed by your caregiver. The use of long-term narcotics is not recommended.  Lie down in a dark, quiet room when you have a migraine.  Keep a journal to find out what may trigger your migraine headaches. For example, write down:  What you eat and drink.  How much sleep you get.  Any change to your diet or medicines.  Limit alcohol consumption.  Quit smoking if you smoke.  Get 7 to 9 hours of sleep, or as recommended by your caregiver.  Limit stress.  Keep lights dim if bright lights bother you and make your migraines worse. SEEK IMMEDIATE MEDICAL CARE IF:   Your migraine becomes severe.  You have a fever.  You have a stiff neck.  You have vision loss.  You have muscular weakness or loss of muscle control.  You start losing your balance or have trouble walking.  You feel faint or pass out.  You have severe symptoms that are different from your first symptoms. MAKE SURE YOU:   Understand these instructions.  Will watch your condition.  Will get help right away if you are not doing well or get worse. Document Released: 09/26/2005 Document Revised: 12/19/2011 Document Reviewed: 09/16/2011 [facility] Patient Information 2014 [place], [place].

## 2013-06-06 ENCOUNTER — Ambulatory Visit: Payer: 59 | Admitting: Family Medicine

## 2013-06-25 ENCOUNTER — Encounter: Payer: Self-pay | Admitting: Family Medicine

## 2013-06-25 ENCOUNTER — Ambulatory Visit (INDEPENDENT_AMBULATORY_CARE_PROVIDER_SITE_OTHER): Payer: 59 | Admitting: Family Medicine

## 2013-06-25 VITALS — BP 127/77 | HR 75 | Temp 98.5°F | Ht 64.0 in | Wt 272.0 lb

## 2013-06-25 DIAGNOSIS — R609 Edema, unspecified: Secondary | ICD-10-CM

## 2013-06-25 DIAGNOSIS — J301 Allergic rhinitis due to pollen: Secondary | ICD-10-CM

## 2013-06-25 DIAGNOSIS — R600 Localized edema: Secondary | ICD-10-CM | POA: Insufficient documentation

## 2013-06-25 LAB — COMPREHENSIVE METABOLIC PANEL
CO2: 25 mEq/L (ref 19–32)
Glucose, Bld: 88 mg/dL (ref 70–99)
Sodium: 138 mEq/L (ref 135–145)
Total Bilirubin: 0.3 mg/dL (ref 0.3–1.2)
Total Protein: 6.2 g/dL (ref 6.0–8.3)

## 2013-06-25 LAB — CBC
MCV: 80.2 fL (ref 78.0–100.0)
Platelets: 176 10*3/uL (ref 150–400)
RBC: 4.15 MIL/uL (ref 3.87–5.11)
WBC: 7.6 10*3/uL (ref 4.0–10.5)

## 2013-06-25 LAB — POCT URINALYSIS DIPSTICK
Bilirubin, UA: NEGATIVE
Glucose, UA: NEGATIVE
Leukocytes, UA: NEGATIVE
Nitrite, UA: NEGATIVE

## 2013-06-25 LAB — POCT UA - MICROSCOPIC ONLY

## 2013-06-25 MED ORDER — CETIRIZINE HCL 10 MG PO TABS: 10.0000 mg | ORAL_TABLET | Freq: Every day | ORAL | Status: DC

## 2013-06-25 MED ORDER — FLUTICASONE PROPIONATE 50 MCG/ACT NA SUSP: 2.0000 | Freq: Every day | NASAL | Status: DC

## 2013-06-25 NOTE — Assessment & Plan Note (Addendum)
Increased congestion and rhinorrhea though afebrile with some nasal discharge and mild sinus tenderness. - Will re-prescribe cetirizine and prescribe flonase for patient to try. She states cetirizine has been helpful in past but she ran out.

## 2013-06-25 NOTE — Patient Instructions (Addendum)
We are checking labs today and I will call you if any are NOT normal. If you do not hear from me in at least a week and are concerned, feel free to call.  Please wear compression socks/stockings, keep your feet up when you can, and eat a low salt diet.   Come in in 2-3 weeks to follow up if no better. Come in sooner if you get short of breath, chest pain, palpitations, much worse swelling, fevers, or severe pain.  DASH Diet The DASH diet stands for "Dietary Approaches to Stop Hypertension." It is a healthy eating plan that has been shown to reduce high blood pressure (hypertension) in as little as 14 days, while also possibly providing other significant health benefits. These other health benefits include reducing the risk of breast cancer after menopause and reducing the risk of type 2 diabetes, heart disease, colon cancer, and stroke. Health benefits also include weight loss and slowing kidney failure in patients with chronic kidney disease.  DIET GUIDELINES  Limit salt (sodium). Your diet should contain less than 1500 mg of sodium daily.  Limit refined or processed carbohydrates. Your diet should include mostly whole grains. Desserts and added sugars should be used sparingly.  Include small amounts of heart-healthy fats. These types of fats include nuts, oils, and tub margarine. Limit saturated and trans fats. These fats have been shown to be harmful in the body. CHOOSING FOODS  The following food groups are based on a 2000 calorie diet. See your Registered Dietitian for individual calorie needs. Grains and Grain Products (6 to 8 servings daily)  Eat More Often: Whole-wheat bread, brown rice, whole-grain or wheat pasta, quinoa, popcorn without added fat or salt (air popped).  Eat Less Often: White bread, white pasta, white rice, cornbread. Vegetables (4 to 5 servings daily)  Eat More Often: Fresh, frozen, and canned vegetables. Vegetables may be raw, steamed, roasted, or grilled with a  minimal amount of fat.  Eat Less Often/Avoid: Creamed or fried vegetables. Vegetables in a cheese sauce. Fruit (4 to 5 servings daily)  Eat More Often: All fresh, canned (in natural juice), or frozen fruits. Dried fruits without added sugar. One hundred percent fruit juice ( cup [237 mL] daily).  Eat Less Often: Dried fruits with added sugar. Canned fruit in light or heavy syrup. [employer], Fish, and Poultry (2 servings or less daily. One serving is 3 to 4 oz [85-114 g]).  Eat More Often: Ninety percent or leaner ground beef, tenderloin, sirloin. Round cuts of beef, chicken breast, [place] breast. All fish. Grill, bake, or broil your meat. Nothing should be fried.  Eat Less Often/Avoid: Fatty cuts of meat, [place], or chicken leg, thigh, or wing. Fried cuts of meat or fish. Dairy (2 to 3 servings)  Eat More Often: Low-fat or fat-free milk, low-fat plain or light yogurt, reduced-fat or part-skim cheese.  Eat Less Often/Avoid: Milk (whole, 2%).Whole milk yogurt. Full-fat cheeses. Nuts, Seeds, and Legumes (4 to 5 servings per week)  Eat More Often: All without added salt.  Eat Less Often/Avoid: Salted nuts and seeds, canned beans with added salt. Fats and Sweets (limited)  Eat More Often: Vegetable oils, tub margarines without trans fats, sugar-free gelatin. Mayonnaise and salad dressings.  Eat Less Often/Avoid: Coconut oils, palm oils, butter, stick margarine, cream, half and half, cookies, candy, pie. FOR MORE INFORMATION The Dash Diet Eating Plan: www.dashdiet.org Document Released: 09/15/2011 Document Revised: 12/19/2011 Document Reviewed: 09/15/2011 [facility] Patient Information 2014 [place], [place].

## 2013-06-25 NOTE — Assessment & Plan Note (Addendum)
No DM or HTN, cardiovascular risk includes tobacco use and obesity, improves with leg elevation. Consider likely venous insufficiency and possible renal disease, liver disease/low albumin, DVT (low risk clinically), thyroid disease, anemia, medication SE (though not on CCB) or CHF (low risk clinical picture). - Check CMET, CBC, TSH, and UA - Encouraged compression stockings/leg elevation/low salt diet - f/u 2wks - Return precautions reviewed

## 2013-06-25 NOTE — Progress Notes (Signed)
Patient ID: Erika Mckenzie, female   DOB: 05-May-1971, 42 y.o.   MRN: [id number] Subjective:   CC: Same day for fluid retention  HPI:  1. Fluid retention - Patient reports ~1 week of lower extremity edema to ankles that makes them tight. Edema is symmetric and not painful, occurs daily though is the least first thing in the morning, and she does not think she has had more standing than usual. This is the first time she has experienced this and is perplexed because her BP is always well-controlled and she tries for low-salt diet. Also reports 8 lb weight gain and increased urination and thirst. Denies new medications, erythema, asymmetry in her legs, chest pain, shortness of breath, palpitations, dysuria, smelly/abnormal-appearing urine, fever, chills, abdominal pain, nausea/vomiting/diarrhea, dizziness, or blurred vision.  Cardiac risk factors: Tobacco use  2. Rhinorrhea - Increased rhinorrhea and congestion but denies pain, fevers, chills. Has tried cetirizine in the past and it helped and she has run out.   Review of Systems - Per HPI.  PMH: - Reviewed, Denies medication changes  SH: - Smokes <1ppd x >10 years, thning about quitting - Alcohol - not even weekly - No drug use, no h/o IV drug use    Objective:  Physical Exam BP 127/77  Pulse 75  Temp(Src) 98.5 F (36.9 C) (Oral)  Ht 5\' 4"  (1.626 m)  Wt 272 lb (123.378 kg)  BMI 46.67 kg/m2 GEN: NAD, pleasant HEENT: Atraumatic, normocephalic, neck supple, EOMI, sclera clear, o/p clear, congestion heard at nose, mild frontal and maxillary tenderness to palpation, no LAD CV: RRR, no murmurs, rubs, or gallops PULM: CTAB, normal effort SKIN: No rash or cyanosis; warm and well-perfused EXTR: 1+ pitting bilateral LE edema at ankle, 2+ bilateral DP pulses, no asymmetry, erythema, or calf tenderness PSYCH: Mood and affect euthymic, normal rate and volume of speech NEURO: Awake, alert, no focal deficits grossly, normal speech  Assessment:      Erika Mckenzie is a 42 y.o. female here for same-day for lower extremity edema.    Plan:     # See problem list for problem-specific plans.

## 2013-06-26 LAB — TSH: TSH: 2.086 u[IU]/mL (ref 0.350–4.500)

## 2013-06-28 ENCOUNTER — Encounter: Payer: Self-pay | Admitting: Family Medicine

## 2013-06-28 NOTE — Progress Notes (Signed)
Patient ID: Erika Mckenzie, female   DOB: 05-15-1971, 42 y.o.   MRN: [id number]  Sent patient letter explaining that hemoglobin slightly low and to consider iron studies if symptomatic, so for patient to follow up with [place] with this information.  Leona Singleton, MD 06/28/2013 2:17 PM

## 2013-08-29 ENCOUNTER — Ambulatory Visit (INDEPENDENT_AMBULATORY_CARE_PROVIDER_SITE_OTHER): Payer: 59 | Admitting: *Deleted

## 2013-08-29 DIAGNOSIS — Z23 Encounter for immunization: Secondary | ICD-10-CM

## 2013-10-22 ENCOUNTER — Other Ambulatory Visit: Payer: Self-pay | Admitting: Family Medicine

## 2013-10-30 ENCOUNTER — Telehealth: Payer: Self-pay | Admitting: Family Medicine

## 2013-10-30 DIAGNOSIS — N898 Other specified noninflammatory disorders of vagina: Secondary | ICD-10-CM

## 2013-10-30 MED ORDER — FLUCONAZOLE 150 MG PO TABS: 150.0000 mg | ORAL_TABLET | Freq: Once | ORAL | Status: DC

## 2013-10-30 NOTE — Telephone Encounter (Signed)
Patient used a new soap and now she has a yeast infection. She is requesting a Rx sent [facility] on [address] for Diflucan. Please call patient when sent.

## 2013-10-30 NOTE — Telephone Encounter (Signed)
Pt states that she "can't take off work, we have lost 6 people and Im telling you that its a yeast infection.  Its discharge and itchy"  Advised I would send message back to MD.  Pt is not happy that this is being requested of her. [place], Salome Spotted

## 2013-10-30 NOTE — Telephone Encounter (Addendum)
Called patient to discuss symptoms. She reports symptoms since yesterday after using a new scented soap (olay), with mild itching, vaginal discahrge, and no odor, dyspareunia, bleeding, abdominal pain, or new sexual partner in 3 years. She has felt this way before with yeast infections due to switching soaps. Due to work schedule, she cannot come into office for eval.  A/P: Pt has had symptoms 2 days with no fever, no dyspareunia, and patient reports no new sexual partners or abdominal pain, and with new soap, this is likely yeast infection. Limited to eval over phone due to patient insistence given tough work schedule. This has occurred to her before and was a yeast infection when she has tried new soaps. - Will rx diflucan 150mg  PO x 1. - Strongly recommend patient come in to clinic if this does not resolve with tx in 1-2 days or if new symptoms, fevers, or chills develop. - Pt voiced understanding.

## 2013-10-30 NOTE — Assessment & Plan Note (Addendum)
Pt has had symptoms 2 days with no fever, no dyspareunia, and patient reports no new sexual partners or abdominal pain, and with new soap, this is likely yeast infection. Limited to eval over phone due to patient insistence given tough work schedule. This has occurred to her before and was a yeast infection when she has tried new soaps. - Will rx diflucan 150mg  PO x 1. - Strongly recommend patient come in to clinic if this does not resolve with tx in 1-2 days or if new symptoms, fevers, or chills develop. - Pt voiced understanding.

## 2013-10-30 NOTE — Telephone Encounter (Signed)
She will need to be evaluated to make sure this is not something else before sending in rx. Thanks.  Hilton Sinclair, MD

## 2013-11-20 ENCOUNTER — Encounter: Payer: Self-pay | Admitting: Family Medicine

## 2013-11-20 ENCOUNTER — Ambulatory Visit (INDEPENDENT_AMBULATORY_CARE_PROVIDER_SITE_OTHER): Payer: 59 | Admitting: Family Medicine

## 2013-11-20 VITALS — BP 122/81 | HR 91 | Temp 98.0°F | Ht 64.0 in | Wt 270.0 lb

## 2013-11-20 DIAGNOSIS — J301 Allergic rhinitis due to pollen: Secondary | ICD-10-CM

## 2013-11-20 DIAGNOSIS — E669 Obesity, unspecified: Secondary | ICD-10-CM

## 2013-11-20 DIAGNOSIS — K219 Gastro-esophageal reflux disease without esophagitis: Secondary | ICD-10-CM

## 2013-11-20 DIAGNOSIS — Z0000 Encounter for general adult medical examination without abnormal findings: Secondary | ICD-10-CM

## 2013-11-20 DIAGNOSIS — Z01419 Encounter for gynecological examination (general) (routine) without abnormal findings: Secondary | ICD-10-CM

## 2013-11-20 LAB — LIPID PANEL
CHOL/HDL RATIO: 3.1 ratio
CHOLESTEROL: 147 mg/dL (ref 0–200)
HDL: 47 mg/dL (ref >39)
LDL Cholesterol: 51 mg/dL (ref 0–99)
TRIGLYCERIDES: 245 mg/dL — AB (ref <150)
VLDL: 49 mg/dL — AB (ref 0–40)

## 2013-11-20 LAB — POCT GLYCOSYLATED HEMOGLOBIN (HGB A1C): Hemoglobin A1C: 5.7

## 2013-11-20 MED ORDER — OMEPRAZOLE 20 MG PO CPDR: 20.0000 mg | DELAYED_RELEASE_CAPSULE | Freq: Every day | ORAL | Status: DC

## 2013-11-20 MED ORDER — CETIRIZINE HCL 10 MG PO TABS
10.0000 mg | ORAL_TABLET | Freq: Every day | ORAL | Status: DC
Start: 2013-11-20 — End: 2015-11-16

## 2013-11-20 MED ORDER — FLUTICASONE PROPIONATE 50 MCG/ACT NA SUSP
2.0000 | Freq: Every day | NASAL | Status: DC
Start: 2013-11-20 — End: 2015-12-28

## 2013-11-20 NOTE — Progress Notes (Signed)
Patient ID: Erika Mckenzie, female   DOB: 08/25/1971, 43 y.o.   MRN: [id number] Subjective:   CC: Physical exam, heartburn, decreased libido  HPI:   Physical exam:   Colonoscopy: No FH of colon cancer. Not due yet. Mammogram: No FH of breast cancer. Has never felt lumps. Does not yet want to have a mammogram but wants to have it before end of this year.  A1c: Mother has diabetes but patient denies any symptoms of blurred vision, urinary frequency, or increased thirst.  BP: Controlled today.  STD check: Declines today. Pap smears: Done 03/2012 and normal; next due 03/2015. Lipids: Patient ate at 12:30 pm today, it is currently 4pm; she does not have time to come back any morning to check fasting lipids due to work schedule. Depression screen: Reports a little sadness, scores 3, denies SI/HI. TDAP, Zostavax, pneumococcal, influenza: - had flu shot Nov 2014 - had Td in 2009 when cut thumb at her job  Menstruation: periods last 8 days, used to last 4-5 days, increased 1 year ago. Also has increased flow. Reports some night sweats but thinks she has the heat up too high at night. Denies unintentional weight loss.  Heartburn - Patient reports that over the past year, she has noticed a burning sensation in her chest and sour taste, specifically when she eats hamburger meat, fried chicken, or spicy foods, and specifically at night. She takes tums (4 tablets, repeats x 1) with some relief. She eats dinner at 7:30PM and goes to bed around 3 hours later. She has had some episodes of nonbloody emesis. It is preventing her from eating what she wants. Symptoms occur 2-3 times weekly. Denies fever/chills. She also feels more gas. She has cut down on coffee in the morning and just has 1 cup. She has also cut down on sodas in the last month.  Decreased libido- She reports not wanting to have sex like she used to, and feeling that she should want to. She is satisfied both with sex or without sex. She is able to  be aroused, but does not ever initiate it. Her husband is starting to get frustrated with this.  The relationship is okay in other ways - good marriage other than that. No pain or discomfort with sex.  Review of Systems - Per HPI.   PMH: NKDA Meds reviewed; not taking naproxen or mobic Medical problems reviewed: - Migraines: Not as bad lately; she does not have many and thinks they were stress-related. - Allergies: She thinks she needs refills on cetirizine and flonase. - Ankle swelling: Still present though not nearly as bad as when last seen. Does not need compression stockings.  SH: Smoking status: Smokes daily. Wants to quit. Exercise: She has really tried doing better - has been going to the gym for the past month, cut back on sodas. 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); Likes job  Objective:  Physical Exam BP 122/81  Pulse 91  Temp(Src) 98 F (36.7 C) (Oral)  Ht $R'5\' 4"'zi$  (1.626 m)  Wt 270 lb (122.471 kg)  BMI 46.32 kg/m2  LMP 11/06/2013 GEN: NAD HEENT: Atraumatic, normocephalic, neck supple, EOMI, sclera clear  CV: RRR, no murmurs, rubs, or gallops, no JVD PULM: CTAB, normal effort ABD: Soft, nontender, nondistended, NABS, no organomegaly, unable to palpate uterine fundus externally; obese SKIN: No rash or cyanosis; warm and well-perfused EXTR: trace LE edema at ankles; no calf tenderness, 1+ bilateral PT pulses PSYCH: Mood and affect euthymic, normal  rate and volume of speech NEURO: Awake, alert, no focal deficits grossly, normal speech    Assessment:     Erika Mckenzie is a 43 y.o. female here for physical exam and to discuss heartburn.    Plan:     # See problem list and after visit summary for problem-specific plans. - Of note, briefly discussed decreased libido and mild menorrhagia, and patient understands to make a separate appointment to discuss in more detail. At that time, consider pelvic ultrasound or endometrial biopsy.  Follow-up: - Follow up  in 2 weeks if no improvement of heartburn symptoms.   Erika Sinclair, MD [place]

## 2013-11-20 NOTE — Patient Instructions (Addendum)
Wonderful to see you today.  You are due for your pap smear in June 2016.   For your heartburn, let us try omeprazole 20mg  daily. This may not help right away. I(f by 2 weeks you notice no improvement, come back to see me.  We are getting labs today. I will call you if any are NOT normal.  I will give you information on mammograms. You can set this up when you want.   Follow up with me when convenient to discuss decreased libido and your vaginal bleeding.  Continue working on weight loss - congratulations on what you have already accomplished.  Continue working on quitting smoking, and let me know how I can help!  Hilton Sinclair, MD   DASH Diet The DASH diet stands for "Dietary Approaches to Stop Hypertension." It is a healthy eating plan that has been shown to reduce high blood pressure (hypertension) in as little as 14 days, while also possibly providing other significant health benefits. These other health benefits include reducing the risk of breast cancer after menopause and reducing the risk of type 2 diabetes, heart disease, colon cancer, and stroke. Health benefits also include weight loss and slowing kidney failure in patients with chronic kidney disease.  DIET GUIDELINES  Limit salt (sodium). Your diet should contain less than 1500 mg of sodium daily.  Limit refined or processed carbohydrates. Your diet should include mostly whole grains. Desserts and added sugars should be used sparingly.  Include small amounts of heart-healthy fats. These types of fats include nuts, oils, and tub margarine. Limit saturated and trans fats. These fats have been shown to be harmful in the body. CHOOSING FOODS  The following food groups are based on a 2000 calorie diet. See your Registered Dietitian for individual calorie needs. Grains and Grain Products (6 to 8 servings daily)  Eat More Often: Whole-wheat bread, brown rice, whole-grain or wheat pasta, quinoa, popcorn without added fat  or salt (air popped).  Eat Less Often: White bread, white pasta, white rice, cornbread. Vegetables (4 to 5 servings daily)  Eat More Often: Fresh, frozen, and canned vegetables. Vegetables may be raw, steamed, roasted, or grilled with a minimal amount of fat.  Eat Less Often/Avoid: Creamed or fried vegetables. Vegetables in a cheese sauce. Fruit (4 to 5 servings daily)  Eat More Often: All fresh, canned (in natural juice), or frozen fruits. Dried fruits without added sugar. One hundred percent fruit juice ( cup [237 mL] daily).  Eat Less Often: Dried fruits with added sugar. Canned fruit in light or heavy syrup. [employer], Fish, and Poultry (2 servings or less daily. One serving is 3 to 4 oz [85-114 g]).  Eat More Often: Ninety percent or leaner ground beef, tenderloin, sirloin. Round cuts of beef, chicken breast, [place] breast. All fish. Grill, bake, or broil your meat. Nothing should be fried.  Eat Less Often/Avoid: Fatty cuts of meat, [place], or chicken leg, thigh, or wing. Fried cuts of meat or fish. Dairy (2 to 3 servings)  Eat More Often: Low-fat or fat-free milk, low-fat plain or light yogurt, reduced-fat or part-skim cheese.  Eat Less Often/Avoid: Milk (whole, 2%).Whole milk yogurt. Full-fat cheeses. Nuts, Seeds, and Legumes (4 to 5 servings per week)  Eat More Often: All without added salt.  Eat Less Often/Avoid: Salted nuts and seeds, canned beans with added salt. Fats and Sweets (limited)  Eat More Often: Vegetable oils, tub margarines without trans fats, sugar-free gelatin. Mayonnaise and salad dressings.  Eat  Less Often/Avoid: Coconut oils, palm oils, butter, stick margarine, cream, half and half, cookies, candy, pie. FOR MORE INFORMATION The Dash Diet Eating Plan: www.dashdiet.org Document Released: 09/15/2011 Document Revised: 12/19/2011 Document Reviewed: 09/15/2011 [facility] Patient Information 2014 [place], [place]. Exercise to Lose Weight Exercise and a  healthy diet may help you lose weight. Your doctor may suggest specific exercises. EXERCISE IDEAS AND TIPS  Choose low-cost things you enjoy doing, such as walking, bicycling, or exercising to workout videos.  Take stairs instead of the elevator.  Walk during your lunch break.  Park your car further away from work or school.  Go to a gym or an exercise class.  Start with 5 to 10 minutes of exercise each day. Build up to 30 minutes of exercise 4 to 6 days a week.  Wear shoes with good support and comfortable clothes.  Stretch before and after working out.  Work out until you breathe harder and your heart beats faster.  Drink extra water when you exercise.  Do not do so much that you hurt yourself, feel dizzy, or get very short of breath. Exercises that burn about 150 calories:  Running 1  miles in 15 minutes.  Playing volleyball for 45 to 60 minutes.  Washing and waxing a car for 45 to 60 minutes.  Playing touch football for 45 minutes.  Walking 1  miles in 35 minutes.  Pushing a stroller 1  miles in 30 minutes.  Playing basketball for 30 minutes.  Raking leaves for 30 minutes.  Bicycling 5 miles in 30 minutes.  Walking 2 miles in 30 minutes.  Dancing for 30 minutes.  Shoveling snow for 15 minutes.  Swimming laps for 20 minutes.  Walking up stairs for 15 minutes.  Bicycling 4 miles in 15 minutes.  Gardening for 30 to 45 minutes.  Jumping rope for 15 minutes.  Washing windows or floors for 45 to 60 minutes. Document Released: 10/29/2010 Document Revised: 12/19/2011 Document Reviewed: 10/29/2010 [facility] Patient Information 2014 [place], [place].

## 2013-11-21 DIAGNOSIS — K219 Gastro-esophageal reflux disease without esophagitis: Secondary | ICD-10-CM | POA: Insufficient documentation

## 2013-11-21 NOTE — Assessment & Plan Note (Signed)
Symptoms consistent with GERD. Patient is taking tums with some relief. - Omeprazole 20mg  daily. - f/u in 2 weeks if no improvement.

## 2013-11-21 NOTE — Assessment & Plan Note (Addendum)
Patient doing well today. - Continue working on weight loss. Pt is going to the gym and has cut down on sodas. - Continue working on smoking cessation. Let us know if you would like to meet with Dr Valentina Lucks for focused smoking cessation counseling. - Checking lipids and A1c today. - Gave information on mammogram. Colonoscopy due at 34. - Pap smear next due June 2016. - Declined STD testing.

## 2013-11-21 NOTE — Assessment & Plan Note (Signed)
-   Refilled cetirizine and flonase today.

## 2013-11-22 ENCOUNTER — Telehealth: Payer: Self-pay | Admitting: Family Medicine

## 2013-11-22 DIAGNOSIS — E781 Pure hyperglyceridemia: Secondary | ICD-10-CM | POA: Insufficient documentation

## 2013-11-22 DIAGNOSIS — R7303 Prediabetes: Secondary | ICD-10-CM | POA: Insufficient documentation

## 2013-11-22 NOTE — Telephone Encounter (Signed)
Called to discuss lab results. Work number listed stated no one by that name worked there. Home number worked and I spoke with [employer].  We discussed that her A1c puts her at risk for diabetes and that her cholesterol levels did not increase her risk of a major cardiovascular event (ASCVD 10 year risk 1.1%) but her triglycerides were high (245). She has already started cutting back on soda and going to the gym and I congratulated her for this. We had also already discussed smoking cessation. We can recheck these values in 6 months.   Hilton Sinclair, MD

## 2014-03-29 ENCOUNTER — Other Ambulatory Visit: Payer: Self-pay | Admitting: Family Medicine

## 2014-03-31 ENCOUNTER — Other Ambulatory Visit: Payer: Self-pay | Admitting: *Deleted

## 2014-04-02 NOTE — Telephone Encounter (Signed)
Pt states that she did pick up medication but has placed other medications on automatic refill.  She forgot to ask them to take this med off the list.  She is fine and doesn't need anymore medication right now. Jazmin Hartsell,CMA

## 2014-04-02 NOTE — Telephone Encounter (Addendum)
Just filled imitrex 10 tabs 4 days ago. For now, will refuse, but please check with patient if she was able to pick this up. If she is needing another so soon, Let me know, and I am happy to fill once more if needed but she would need appt today or tomorrow for re-eval as well.  Hilton Sinclair, MD

## 2015-05-18 ENCOUNTER — Encounter: Payer: Self-pay | Admitting: Obstetrics and Gynecology

## 2015-05-18 ENCOUNTER — Ambulatory Visit (INDEPENDENT_AMBULATORY_CARE_PROVIDER_SITE_OTHER): Payer: 59 | Admitting: Obstetrics and Gynecology

## 2015-05-18 VITALS — BP 146/86 | HR 84 | Temp 98.6°F | Ht 64.0 in | Wt 251.0 lb

## 2015-05-18 DIAGNOSIS — R519 Headache, unspecified: Secondary | ICD-10-CM

## 2015-05-18 DIAGNOSIS — R51 Headache: Secondary | ICD-10-CM

## 2015-05-18 DIAGNOSIS — G43009 Migraine without aura, not intractable, without status migrainosus: Secondary | ICD-10-CM | POA: Diagnosis not present

## 2015-05-18 MED ORDER — SUMATRIPTAN SUCCINATE 50 MG PO TABS
50.0000 mg | ORAL_TABLET | ORAL | Status: DC | PRN
Start: 2015-05-18 — End: 2016-12-07

## 2015-05-18 MED ORDER — KETOROLAC TROMETHAMINE 30 MG/ML IJ SOLN
30.0000 mg | Freq: Once | INTRAMUSCULAR | Status: AC
  Administered 2015-05-18: 30 mg via INTRAMUSCULAR

## 2015-05-18 NOTE — Patient Instructions (Signed)
Please pick up your medication and take today  You received a Toradol injection in the clinic for pain  Migraine Headache A migraine headache is very bad, throbbing pain on one or both sides of your head. Talk to your doctor about what things may bring on (trigger) your migraine headaches. HOME CARE  Only take medicines as told by your doctor.  Lie down in a dark, quiet room when you have a migraine.  Keep a journal to find out if certain things bring on migraine headaches. For example, write down:  What you eat and drink.  How much sleep you get.  Any change to your diet or medicines.  Lessen how much alcohol you drink.  Quit smoking if you smoke.  Get enough sleep.  Lessen any stress in your life.  Keep lights dim if bright lights bother you or make your migraines worse. GET HELP RIGHT AWAY IF:   Your migraine becomes really bad.  You have a fever.  You have a stiff neck.  You have trouble seeing.  Your muscles are weak, or you lose muscle control.  You lose your balance or have trouble walking.  You feel like you will pass out (faint), or you pass out.  You have really bad symptoms that are different than your first symptoms. MAKE SURE YOU:   Understand these instructions.  Will watch your condition.  Will get help right away if you are not doing well or get worse. Document Released: 07/05/2008 Document Revised: 12/19/2011 Document Reviewed: 06/03/2013 [facility] Patient Information 2015 [place], [place]. This information is not intended to replace advice given to you by your health care provider. Make sure you discuss any questions you have with your health care provider.

## 2015-05-18 NOTE — Progress Notes (Signed)
   Subjective:   Patient ID: Erika Mckenzie, female    DOB: Sep 29, 1971, 44 y.o.   MRN: [id number]  Patient presents for Same Day Appointment  CC: Headache  HPI: # HEADACHE Headache started 4 days ago Pain is debilitating. Description is a throbbing and piercing pain Severity: 8/10 but was 10/10 this morning and patient was unable to go to work Location: over eyes and behind  ears Medications tried: goody powder which dulled it some but didn't get rid of headache Head trauma: denies Previous similar headaches: yes Taking blood thinners: no This has happened before Patient thinks cause of headache is her usual migraine headaches She states that she ran out of her sumatriptan at home usually relieves her headache Gets migraines to 3 times a year Unknown trigger usually occurs around her menstrual cycle  Symptoms Nose congestion stuffiness:  no Nausea vomiting: no Photophobia: yes Noise sensitivity: yes Double vision or loss of vision: no Fever: no Neck Stiffness: no Trouble walking or speaking: no Denies aura  See HPI for ROS  Past medical history, surgical, family, and social history reviewed and updated in the EMR as appropriate.  Objective:  BP 146/86 mmHg  Pulse 84  Temp(Src) 98.6 F (37 C) (Oral)  Ht 5\' 4"  (1.626 m)  Wt 251 lb (113.853 kg)  BMI 43.06 kg/m2  LMP 05/11/2015 Vitals and nursing note reviewed  Physical Exam  Constitutional: She is oriented to person, place, and time and well-developed, well-nourished, and in no distress.  HENT:  Head: Normocephalic and atraumatic.  Mouth/Throat: Oropharynx is clear and moist.  Eyes: Conjunctivae are normal. Pupils are equal, round, and reactive to light.  Neck: Normal range of motion.  Neurological: She is alert and oriented to person, place, and time. She has normal sensation, normal strength and intact cranial nerves.    Assessment & Plan:  See Problem List Documentation   Luiz Blare, DO 05/18/2015, 3:32  PM PGY-2, [facility]

## 2015-05-19 NOTE — Assessment & Plan Note (Signed)
A: Headache consistent with typical migraines without aura. No known precipitant. P: - Toradol given in office for pain - Rx for Imitrex refilled; patient to pick-up at pharmacy  - RTC if not improved in the next 1-3 days after tretment - Handout given

## 2015-11-16 ENCOUNTER — Ambulatory Visit: Payer: 59 | Admitting: Family Medicine

## 2015-11-16 ENCOUNTER — Ambulatory Visit (INDEPENDENT_AMBULATORY_CARE_PROVIDER_SITE_OTHER): Payer: 59 | Admitting: Family Medicine

## 2015-11-16 ENCOUNTER — Encounter: Payer: Self-pay | Admitting: Family Medicine

## 2015-11-16 VITALS — BP 131/58 | HR 84 | Temp 98.3°F | Wt 242.8 lb

## 2015-11-16 DIAGNOSIS — M25511 Pain in right shoulder: Secondary | ICD-10-CM | POA: Diagnosis not present

## 2015-11-16 DIAGNOSIS — J301 Allergic rhinitis due to pollen: Secondary | ICD-10-CM | POA: Diagnosis not present

## 2015-11-16 MED ORDER — MELOXICAM 7.5 MG PO TABS: 7.5000 mg | ORAL_TABLET | Freq: Every day | ORAL | Status: DC

## 2015-11-16 MED ORDER — CETIRIZINE HCL 10 MG PO TABS: 10.0000 mg | ORAL_TABLET | Freq: Every day | ORAL | Status: DC

## 2015-11-16 MED ORDER — CYCLOBENZAPRINE HCL 10 MG PO TABS: 10.0000 mg | ORAL_TABLET | Freq: Three times a day (TID) | ORAL | Status: DC | PRN

## 2015-11-16 NOTE — Patient Instructions (Signed)
You probably have a muscle strain in the muscles of your back and shoulder. This can take up to 3-4 weeks to heal. We will give you an anti-inflammatory medication to treat this. Please take mobic once daily for the next 2 weeks. Please use the flexeril as needed up to 3 times daily. Do not drive or work while taking this medication. You can place ice packs on your shoulder for 20 to 30 minutes every 3 to 4 hours as needed for pain.  If your symptoms are not improving, please come back.  Take care,  Dr Jerline Pain

## 2015-11-16 NOTE — Progress Notes (Signed)
    Subjective:  Erika Mckenzie is a 45 y.o. female who presents to the [facility] today for same day appointment with a chief complaint of shoulder pain.   HPI:  Right Shoulder Pain Started 2 months ago. Located in the posterior aspect of her right shoulder. Pain is constant and worse with movement. Has not noticed anything that makes the pain better. Does not do any overheard work or use her shoulder a lot with her occupation. Patient is a Games developer. No injuries or trauma. No fevers or chills. Has tried naproxen, tyelnol, and Goodies powder which help, though the pain comes back.   ROS: Per HPI  Objective:  Physical Exam: BP 131/58 mmHg  Pulse 84  Temp(Src) 98.3 F (36.8 C) (Oral)  Wt 242 lb 12.8 oz (110.133 kg)  LMP 10/26/2015 (Approximate)  Gen: NAD, resting comfortably MSK:  - Right Shoulder: No deformities or effusion. Tender to palpation along rhomboid and supraspinatous muscles. FROM with pain when reaching above head. Neer and Hawking negative. Pain elicited with resisted extension at 30 degrees abduction. Normal internal and external rotation.  - Left shoulder: No deformities or effusions. Nontender. FROM without pain. Neer and Hawking negative.  Neuro: grossly normal, moves all extremities Psych: Normal affect and thought content  Assessment/Plan:  Right Shoulder Pain Most likely muscular strain given tenderness of rhomboid on palpation and elicited pain at supraspinatous with resisted extension. Doubt rotator cuff pathology given location of pain and negative Neer and Hawkin's test. Will treat with course meloxicam. Will also give flexeril to treat spasm component. Encouraged ice as needed when painful and gave handout for exercises for rhomboid strain. Return precautions reviewed. If not improving in 3-4 weeks may consider referral to PT and imaging.   Algis Greenhouse. Jerline Pain, [place] Resident PGY-2 11/16/2015 3:06 PM

## 2015-12-28 ENCOUNTER — Ambulatory Visit (INDEPENDENT_AMBULATORY_CARE_PROVIDER_SITE_OTHER): Payer: 59 | Admitting: Family Medicine

## 2015-12-28 ENCOUNTER — Encounter: Payer: Self-pay | Admitting: Family Medicine

## 2015-12-28 VITALS — BP 120/74 | HR 79 | Temp 98.4°F | Wt 255.9 lb

## 2015-12-28 DIAGNOSIS — H9191 Unspecified hearing loss, right ear: Secondary | ICD-10-CM | POA: Insufficient documentation

## 2015-12-28 DIAGNOSIS — J301 Allergic rhinitis due to pollen: Secondary | ICD-10-CM

## 2015-12-28 MED ORDER — CARBAMIDE PEROXIDE 6.5 % OT SOLN: 5.0000 [drp] | Freq: Two times a day (BID) | OTIC | Status: DC

## 2015-12-28 MED ORDER — FLUTICASONE PROPIONATE 50 MCG/ACT NA SUSP: 2.0000 | Freq: Every day | NASAL | Status: DC

## 2015-12-28 NOTE — Assessment & Plan Note (Signed)
Sinus congestion and pressure without fevers - refilled flonase

## 2015-12-28 NOTE — Assessment & Plan Note (Signed)
Right ear canal obstructed by wax and currently has sinus congestion - Debrox - refilled flonase - f/u in 1 week if symptoms persist

## 2015-12-28 NOTE — Patient Instructions (Signed)
Here, decreased hearing, dizziness and ear fullness is most likely related to sinus infection as well as earwax impaction.  - Restart Flonase daily - Used Debrox twice a day for the next 7-10 days - Return to clinic if you're decreased hearing, dizziness continues after these treatments

## 2015-12-28 NOTE — Progress Notes (Signed)
   Subjective:    Patient ID: Erika Mckenzie, female    DOB: [birth year], 45 y.o.   MRN: [id number]  Seen for Same day visit for   CC: decreased hearing in right ear - decreased hearing x 3 days - associated with nasal congestion, sinus pressure - denies HA, ringing in ears, fevers - some dizziness / light-headedness earlier today but no tunnel vision, chest pain, palpitations, SOB  Review of Systems   See HPI for ROS. Objective:  BP 120/74 mmHg  Pulse 79  Temp(Src) 98.4 F (36.9 C) (Oral)  Wt 255 lb 14.4 oz (116.075 kg)  LMP 12/21/2015  General: NAD HEENT: sinus tenderness; Right TM obstructed by wax Cardiac: RRR, normal heart sounds, no murmurs. 2+ radial and PT pulses bilaterally Respiratory: CTAB, normal effort    Assessment & Plan:   ALLERGIC RHINITIS, SEASONAL Sinus congestion and pressure without fevers - refilled flonase  Decreased hearing of right ear Right ear canal obstructed by wax and currently has sinus congestion - Debrox - refilled flonase - f/u in 1 week if symptoms persist

## 2016-05-20 ENCOUNTER — Encounter: Payer: Self-pay | Admitting: Family Medicine

## 2016-05-20 ENCOUNTER — Ambulatory Visit (INDEPENDENT_AMBULATORY_CARE_PROVIDER_SITE_OTHER): Payer: 59 | Admitting: Family Medicine

## 2016-05-20 VITALS — BP 122/59 | HR 85 | Ht 65.0 in | Wt 250.0 lb

## 2016-05-20 DIAGNOSIS — M25569 Pain in unspecified knee: Secondary | ICD-10-CM

## 2016-05-20 DIAGNOSIS — M25561 Pain in right knee: Secondary | ICD-10-CM | POA: Diagnosis not present

## 2016-05-20 DIAGNOSIS — G8929 Other chronic pain: Secondary | ICD-10-CM | POA: Insufficient documentation

## 2016-05-20 MED ORDER — METHYLPREDNISOLONE ACETATE 40 MG/ML IJ SUSP
40.0000 mg | Freq: Once | INTRAMUSCULAR | Status: AC
  Administered 2016-05-20: 40 mg via INTRA_ARTICULAR

## 2016-05-20 NOTE — Assessment & Plan Note (Signed)
Discussed options. Weight loss would definitely be a plus for her long-term for knees. The short-term, we discussed possibly using nitroglycerin glycerin patch which I'm not sure would be beneficial, MRI, CSI. She chose corticosteroid injection today with follow-up in one to 2 weeks. I gave her red flags to watch out for including significant knee pain worsening, significant swelling. 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 Notably we do not have any current x-rays on her so we would need to get those first.. Ultimately weight loss would be extremely beneficial and she's aware.

## 2016-05-20 NOTE — Progress Notes (Signed)
  Erika Mckenzie - 45 y.o. female MRN [id number]  Date of birth: [birth year]    SUBJECTIVE:     Chief Complaint: Right knee pain  HPI: Last 1-2 weeks she's had increasing stiffness and pain in her right knee. Only difference in her activities as she has been going up and down stairs a little more often than usual. She's noticed no locking, no giving way. She has had some swelling. She's noticed no redness. She's had no calf pain. Denies specific injury. Pain is anterior in the knee, achy accompanied by stiffness particularly in the morning. Pain 4-6 out of 10. Worse with stairs. ROS:     No unusual weight change, fever, sweats, chills. No other joints are bothering her, she's had no unusual myalgias.  PERTINENT  PMH / PSH FH / / SH:  Past Medical, Surgical, Social, and Family History Reviewed & Updated in the EMR.  Pertinent findings include:  No prior history of knee injury her knee surgery on the right. History of carpal tunnel syndrome Obesity tobacco dependence GERD Hypertriglyceridemia Migraine   OBJECTIVE: BP (!) 122/59   Pulse 85   Ht 5\' 5"  (1.651 m)   Wt 250 lb (113.4 kg)   BMI 41.60 kg/m   Physical Exam:  Vital signs are reviewed. GEN.: Well-developed overweight female no acute distress KNEES: Right knee reveals small effusion. Her kneecaps point to little bit laterally rather than straightforward on both sides.  RIGHT KNEE: Crepitus with extension. Full range of motion in extension. She has full range of motion in flexion but she has a lot of stiffness and pain with the last 20. McMurray testing is uncomfortable but there is no palpable pop. Distally is uncomfortable but no acute pain. She's ligamentously intact to varus and valgus stress, normal Lockman. Popliteal space is benign. The calf is soft.  VASCULAR: Dorsalis pedis pulses 2+ bilaterally equal . NEURO: Intact sensation soft touch bilateral lower extremity. Normal gait and balance.   ULTRASOUND: Lateral meniscus is  not seen well at all. Medial meniscus is seen and appears to be slightly frayed at the proximal border. There is a lot of fluid in the suprapatellar pouch and tracking down along the medial side to the joint space. The patellar and quadricep tendon are normal.   INJECTION: Patient was given informed consent, signed copy in the chart. Appropriate time out was taken. Area prepped and draped in usual sterile fashion. 1 cc of methylprednisolone 40 mg/ml plus  4 cc of 1% lidocaine without epinephrine was injected into the right knee joint using a(n) anterior medial approach. The patient tolerated the procedure well. There were no complications. Post procedure instructions were given.  ASSESSMENT & PLAN:  See problem based charting & AVS for pt instructions.

## 2016-05-23 MED ORDER — CYCLOBENZAPRINE HCL 10 MG PO TABS: 10.0000 mg | ORAL_TABLET | Freq: Every evening | ORAL | 1 refills | Status: DC | PRN

## 2016-05-23 NOTE — Addendum Note (Signed)
Addended byDickie La on: 05/23/2016 08:44 AM   Modules accepted: Orders

## 2016-05-25 ENCOUNTER — Telehealth: Payer: Self-pay | Admitting: *Deleted

## 2016-05-25 NOTE — Telephone Encounter (Signed)
Is there anything we should suggest for her, she already has tramadol?

## 2016-05-26 NOTE — Telephone Encounter (Signed)
Rhea When she was seen I agreed to give her 10 mg flexeril at night which I think I sent on Monday. I am not Rx her tramadol---I think that is her PCP. THANKS!  Dorcas Mcmurray

## 2016-06-10 ENCOUNTER — Ambulatory Visit: Payer: 59 | Admitting: Family Medicine

## 2016-07-29 ENCOUNTER — Other Ambulatory Visit (HOSPITAL_COMMUNITY)
Admission: RE | Admit: 2016-07-29 | Discharge: 2016-07-29 | Disposition: A | Discharge status: Home / Self Care | Payer: 59 | Source: Ambulatory Visit | Attending: Family Medicine | Admitting: Family Medicine

## 2016-07-29 ENCOUNTER — Ambulatory Visit (INDEPENDENT_AMBULATORY_CARE_PROVIDER_SITE_OTHER): Payer: 59 | Admitting: Obstetrics and Gynecology

## 2016-07-29 ENCOUNTER — Encounter: Payer: Self-pay | Admitting: Obstetrics and Gynecology

## 2016-07-29 VITALS — BP 131/68 | HR 96 | Wt 254.0 lb

## 2016-07-29 DIAGNOSIS — E781 Pure hyperglyceridemia: Secondary | ICD-10-CM

## 2016-07-29 DIAGNOSIS — Z23 Encounter for immunization: Secondary | ICD-10-CM

## 2016-07-29 DIAGNOSIS — Z124 Encounter for screening for malignant neoplasm of cervix: Secondary | ICD-10-CM

## 2016-07-29 DIAGNOSIS — Z113 Encounter for screening for infections with a predominantly sexual mode of transmission: Secondary | ICD-10-CM

## 2016-07-29 DIAGNOSIS — Z01419 Encounter for gynecological examination (general) (routine) without abnormal findings: Secondary | ICD-10-CM | POA: Diagnosis not present

## 2016-07-29 DIAGNOSIS — N760 Acute vaginitis: Secondary | ICD-10-CM | POA: Diagnosis present

## 2016-07-29 DIAGNOSIS — IMO0001 Reserved for inherently not codable concepts without codable children: Secondary | ICD-10-CM

## 2016-07-29 DIAGNOSIS — N926 Irregular menstruation, unspecified: Secondary | ICD-10-CM

## 2016-07-29 DIAGNOSIS — Z1151 Encounter for screening for human papillomavirus (HPV): Secondary | ICD-10-CM | POA: Diagnosis present

## 2016-07-29 DIAGNOSIS — R7303 Prediabetes: Secondary | ICD-10-CM

## 2016-07-29 DIAGNOSIS — R7309 Other abnormal glucose: Secondary | ICD-10-CM | POA: Diagnosis not present

## 2016-07-29 DIAGNOSIS — F17200 Nicotine dependence, unspecified, uncomplicated: Secondary | ICD-10-CM

## 2016-07-29 DIAGNOSIS — E669 Obesity, unspecified: Secondary | ICD-10-CM

## 2016-07-29 DIAGNOSIS — Z6841 Body Mass Index (BMI) 40.0 and over, adult: Secondary | ICD-10-CM

## 2016-07-29 LAB — BASIC METABOLIC PANEL
BUN: 11 mg/dL (ref 7–25)
CHLORIDE: 104 mmol/L (ref 98–110)
CO2: 23 mmol/L (ref 20–31)
Calcium: 8.9 mg/dL (ref 8.6–10.2)
Creat: 0.9 mg/dL (ref 0.50–1.10)
Glucose, Bld: 96 mg/dL (ref 65–99)
Potassium: 4.1 mmol/L (ref 3.5–5.3)
SODIUM: 137 mmol/L (ref 135–146)

## 2016-07-29 LAB — LIPID PANEL
Cholesterol: 147 mg/dL (ref 125–200)
HDL: 48 mg/dL (ref >=46)
LDL CALC: 62 mg/dL (ref <130)
Total CHOL/HDL Ratio: 3.1 Ratio (ref <=5.0)
Triglycerides: 187 mg/dL — ABNORMAL HIGH (ref <150)
VLDL: 37 mg/dL — ABNORMAL HIGH (ref <30)

## 2016-07-29 LAB — POCT GLYCOSYLATED HEMOGLOBIN (HGB A1C): HEMOGLOBIN A1C: 5.6

## 2016-07-29 NOTE — Progress Notes (Signed)
     Subjective: Chief Complaint  Patient presents with  . Annual Exam     HPI: Erika Mckenzie is a 45 y.o. female presents for well woman/preventative visit and annual GYN examination.  Acute Concerns:  1. Irregualr cycles. Starting to have periods more frequently and having missed periods. Last month had period twice (every 2 weeks), Very heavy. Bleeds for about 8 days. Unsure of when women in her family go through menopause.  2. Prediaebets. Sugar checked at job and said numbers were good. Has h/o prediabetes. Not on medication 3. Hypertriglyceridemia. Not on medication. Wants it to be rechecked with other cholesterol levels.   Diet: not eating enough (no time), frozen dinners, processed food   Exercise: no  Sexual History: not currently Birth history: G3P2  LMP: Patient's last menstrual period was 07/25/2016 (exact date).  Birth Control: Tubes tied 13 yrs ago  POA/Living Will: No  Social:  Social History   Social History  . Marital status: Legally Separated    Spouse name: N/A  . Number of children: N/A  . Years of education: N/A   Social History Main Topics  . Smoking status: Current Every Day Smoker    Packs/day: 0.50    Types: Cigarettes  . Smokeless tobacco: None  . Alcohol use None  . Drug use: Unknown  . Sexual activity: Yes    Birth control/ protection: Surgical   Other Topics Concern  . None   Social History Narrative  . None    Immunization:  Tdap/TD: Up to date  Influenza: Due  Immunization History  Administered Date(s) Administered  . Influenza Split 08/08/2011  . Influenza,inj,Quad PF,36+ Mos 08/29/2013, 07/29/2016  . Td 09/09/2006   Cancer Screening:  Pap Smear: Due  Mammogram: Due  ROS noted in HPI.  Past Medical, Surgical, Social, and Family History Reviewed & Updated per EMR.  Objective: BP 131/68   Pulse 96   Wt 254 lb (115.2 kg)   LMP 07/25/2016 (Exact Date)   BMI 42.27 kg/m  Vitals and nursing notes  reviewed  Physical Exam  Constitutional: She is oriented to person, place, and time and well-developed, well-nourished, and in no distress.  HENT:  Head: Normocephalic and atraumatic.  Mouth/Throat: Oropharynx is clear and moist.  Eyes: Conjunctivae and EOM are normal. Pupils are equal, round, and reactive to light.  Neck: Normal range of motion. Neck supple.  Cardiovascular: Normal rate, regular rhythm, normal heart sounds and intact distal pulses.   Pulmonary/Chest: Effort normal and breath sounds normal. She has no wheezes. She has no rales.  Abdominal: Soft. Bowel sounds are normal. She exhibits no distension and no mass. There is no tenderness.  Genitourinary: Uterus normal, cervix normal, right adnexa normal and left adnexa normal. Vaginal discharge found.  Musculoskeletal: Normal range of motion.  Neurological: She is alert and oriented to person, place, and time. No cranial nerve deficit.  Grossly non-focal  Skin: Skin is warm and dry.  Psychiatric: Mood and affect normal.    Assessment/Plan: 45 y.o. female presents for annual well woman/preventative exam and GYN exam. Please see problem specific assessment and plan.     Orders Placed This Encounter  Procedures  . Flu Vaccine QUAD 36+ mos IM  . Lipid Panel  . Basic Metabolic Panel  . HgB A1c    Luiz Blare, DO 07/29/2016, 2:34 PM PGY-3, [place]

## 2016-07-29 NOTE — Patient Instructions (Addendum)
Will contact you about labs  Health Maintenance, Female Adopting a healthy lifestyle and getting preventive care can go a long way to promote health and wellness. Talk with your health care provider about what schedule of regular examinations is right for you. This is a good chance for you to check in with your provider about disease prevention and staying healthy. In between checkups, there are plenty of things you can do on your own. Experts have done a lot of research about which lifestyle changes and preventive measures are most likely to keep you healthy. Ask your health care provider for more information. WEIGHT AND DIET  Eat a healthy diet  Be sure to include plenty of vegetables, fruits, low-fat dairy products, and lean protein.  Do not eat a lot of foods high in solid fats, added sugars, or salt.  Get regular exercise. This is one of the most important things you can do for your health.  Most adults should exercise for at least 150 minutes each week. The exercise should increase your heart rate and make you sweat (moderate-intensity exercise).  Most adults should also do strengthening exercises at least twice a week. This is in addition to the moderate-intensity exercise.  Maintain a healthy weight  Body mass index (BMI) is a measurement that can be used to identify possible weight problems. It estimates body fat based on height and weight. Your health care provider can help determine your BMI and help you achieve or maintain a healthy weight.  For females 56 years of age and older:   A BMI below 18.5 is considered underweight.  A BMI of 18.5 to 24.9 is normal.  A BMI of 25 to 29.9 is considered overweight.  A BMI of 30 and above is considered obese.  Watch levels of cholesterol and blood lipids  You should start having your blood tested for lipids and cholesterol at 44 years of age, then have this test every 5 years.  You may need to have your cholesterol levels checked  more often if:  Your lipid or cholesterol levels are high.  You are older than 44 years of age.  You are at high risk for heart disease.  CANCER SCREENING   Lung Cancer  Lung cancer screening is recommended for adults 93-35 years old who are at high risk for lung cancer because of a history of smoking.  A yearly low-dose CT scan of the lungs is recommended for people who:  Currently smoke.  Have quit within the past 15 years.  Have at least a 30-pack-year history of smoking. A pack year is smoking an average of one pack of cigarettes a day for 1 year.  Yearly screening should continue until it has been 15 years since you quit.  Yearly screening should stop if you develop a health problem that would prevent you from having lung cancer treatment.  Breast Cancer  Practice breast self-awareness. This means understanding how your breasts normally appear and feel.  It also means doing regular breast self-exams. Let your health care provider know about any changes, no matter how small.  If you are in your 20s or 30s, you should have a clinical breast exam (CBE) by a health care provider every 1-3 years as part of a regular health exam.  If you are 39 or older, have a CBE every year. Also consider having a breast X-ray (mammogram) every year.  If you have a family history of breast cancer, talk to your health care provider  about genetic screening.  If you are at high risk for breast cancer, talk to your health care provider about having an MRI and a mammogram every year.  Breast cancer gene (BRCA) assessment is recommended for women who have family members with BRCA-related cancers. BRCA-related cancers include:  Breast.  Ovarian.  Tubal.  Peritoneal cancers.  Results of the assessment will determine the need for genetic counseling and BRCA1 and BRCA2 testing. Cervical Cancer Your health care provider may recommend that you be screened regularly for cancer of the pelvic  organs (ovaries, uterus, and vagina). This screening involves a pelvic examination, including checking for microscopic changes to the surface of your cervix (Pap test). You may be encouraged to have this screening done every 3 years, beginning at age 36.  For women ages 41-65, health care providers may recommend pelvic exams and Pap testing every 3 years, or they may recommend the Pap and pelvic exam, combined with testing for human papilloma virus (HPV), every 5 years. Some types of HPV increase your risk of cervical cancer. Testing for HPV may also be done on women of any age with unclear Pap test results.  Other health care providers may not recommend any screening for nonpregnant women who are considered low risk for pelvic cancer and who do not have symptoms. Ask your health care provider if a screening pelvic exam is right for you.  If you have had past treatment for cervical cancer or a condition that could lead to cancer, you need Pap tests and screening for cancer for at least 20 years after your treatment. If Pap tests have been discontinued, your risk factors (such as having a new sexual partner) need to be reassessed to determine if screening should resume. Some women have medical problems that increase the chance of getting cervical cancer. In these cases, your health care provider may recommend more frequent screening and Pap tests. Colorectal Cancer  This type of cancer can be detected and often prevented.  Routine colorectal cancer screening usually begins at 44 years of age and continues through 44 years of age.  Your health care provider may recommend screening at an earlier age if you have risk factors for colon cancer.  Your health care provider may also recommend using home test kits to check for hidden blood in the stool.  A small camera at the end of a tube can be used to examine your colon directly (sigmoidoscopy or colonoscopy). This is done to check for the earliest forms  of colorectal cancer.  Routine screening usually begins at age 26.  Direct examination of the colon should be repeated every 5-10 years through 44 years of age. However, you may need to be screened more often if early forms of precancerous polyps or small growths are found. Skin Cancer  Check your skin from head to toe regularly.  Tell your health care provider about any new moles or changes in moles, especially if there is a change in a mole's shape or color.  Also tell your health care provider if you have a mole that is larger than the size of a pencil eraser.  Always use sunscreen. Apply sunscreen liberally and repeatedly throughout the day.  Protect yourself by wearing long sleeves, pants, a wide-brimmed hat, and sunglasses whenever you are outside. HEART DISEASE, DIABETES, AND HIGH BLOOD PRESSURE   High blood pressure causes heart disease and increases the risk of stroke. High blood pressure is more likely to develop in:  People who have  blood pressure in the high end of the normal range (130-139/85-89 mm Hg).  People who are overweight or obese.  People who are African American.  If you are 88-19 years of age, have your blood pressure checked every 3-5 years. If you are 3 years of age or older, have your blood pressure checked every year. You should have your blood pressure measured twice--once when you are at a hospital or clinic, and once when you are not at a hospital or clinic. Record the average of the two measurements. To check your blood pressure when you are not at a hospital or clinic, you can use:  An automated blood pressure machine at a pharmacy.  A home blood pressure monitor.  If you are between 68 years and 62 years old, ask your health care provider if you should take aspirin to prevent strokes.  Have regular diabetes screenings. This involves taking a blood sample to check your fasting blood sugar level.  If you are at a normal weight and have a low risk  for diabetes, have this test once every three years after 44 years of age.  If you are overweight and have a high risk for diabetes, consider being tested at a younger age or more often. PREVENTING INFECTION  Hepatitis B  If you have a higher risk for hepatitis B, you should be screened for this virus. You are considered at high risk for hepatitis B if:  You were born in a country where hepatitis B is common. Ask your health care provider which countries are considered high risk.  Your parents were born in a high-risk country, and you have not been immunized against hepatitis B (hepatitis B vaccine).  You have HIV or AIDS.  You use needles to inject street drugs.  You live with someone who has hepatitis B.  You have had sex with someone who has hepatitis B.  You get hemodialysis treatment.  You take certain medicines for conditions, including cancer, organ transplantation, and autoimmune conditions. Hepatitis C  Blood testing is recommended for:  Everyone born from [birth year] through 1964.  Anyone with known risk factors for hepatitis C. Sexually transmitted infections (STIs)  You should be screened for sexually transmitted infections (STIs) including gonorrhea and chlamydia if:  You are sexually active and are younger than 44 years of age.  You are older than 44 years of age and your health care provider tells you that you are at risk for this type of infection.  Your sexual activity has changed since you were last screened and you are at an increased risk for chlamydia or gonorrhea. Ask your health care provider if you are at risk.  If you do not have HIV, but are at risk, it may be recommended that you take a prescription medicine daily to prevent HIV infection. This is called pre-exposure prophylaxis (PrEP). You are considered at risk if:  You are sexually active and do not regularly use condoms or know the HIV status of your partner(s).  You take drugs by injection.  You  are sexually active with a partner who has HIV. Talk with your health care provider about whether you are at high risk of being infected with HIV. If you choose to begin PrEP, you should first be tested for HIV. You should then be tested every 3 months for as long as you are taking PrEP.  PREGNANCY   If you are premenopausal and you may become pregnant, ask your health care provider about  preconception counseling.  If you may become pregnant, take 400 to 800 micrograms (mcg) of folic acid every day.  If you want to prevent pregnancy, talk to your health care provider about birth control (contraception). OSTEOPOROSIS AND MENOPAUSE   Osteoporosis is a disease in which the bones lose minerals and strength with aging. This can result in serious bone fractures. Your risk for osteoporosis can be identified using a bone density scan.  If you are 86 years of age or older, or if you are at risk for osteoporosis and fractures, ask your health care provider if you should be screened.  Ask your health care provider whether you should take a calcium or vitamin D supplement to lower your risk for osteoporosis.  Menopause may have certain physical symptoms and risks.  Hormone replacement therapy may reduce some of these symptoms and risks. Talk to your health care provider about whether hormone replacement therapy is right for you.  HOME CARE INSTRUCTIONS   Schedule regular health, dental, and eye exams.  Stay current with your immunizations.   Do not use any tobacco products including cigarettes, chewing tobacco, or electronic cigarettes.  If you are pregnant, do not drink alcohol.  If you are breastfeeding, limit how much and how often you drink alcohol.  Limit alcohol intake to no more than 1 drink per day for nonpregnant women. One drink equals 12 ounces of beer, 5 ounces of wine, or 1 ounces of hard liquor.  Do not use street drugs.  Do not share needles.  Ask your health care provider  for help if you need support or information about quitting drugs.  Tell your health care provider if you often feel depressed.  Tell your health care provider if you have ever been abused or do not feel safe at home.   This information is not intended to replace advice given to you by your health care provider. Make sure you discuss any questions you have with your health care provider.   Document Released: 04/11/2011 Document Revised: 10/17/2014 Document Reviewed: 08/28/2013 Elsevier Interactive Patient Education [employer].

## 2016-08-01 LAB — CERVICOVAGINAL ANCILLARY ONLY
CHLAMYDIA, DNA PROBE: NEGATIVE
NEISSERIA GONORRHEA: NEGATIVE
TRICH (WINDOWPATH): POSITIVE — AB

## 2016-08-01 LAB — CYTOLOGY - PAP
Diagnosis: NEGATIVE
HPV (WINDOPATH): NOT DETECTED

## 2016-08-03 ENCOUNTER — Telehealth: Payer: Self-pay | Admitting: Obstetrics and Gynecology

## 2016-08-03 ENCOUNTER — Other Ambulatory Visit: Payer: Self-pay | Admitting: Obstetrics and Gynecology

## 2016-08-03 DIAGNOSIS — N926 Irregular menstruation, unspecified: Secondary | ICD-10-CM | POA: Insufficient documentation

## 2016-08-03 LAB — CERVICOVAGINAL ANCILLARY ONLY: CANDIDA VAGINITIS: NEGATIVE

## 2016-08-03 MED ORDER — METRONIDAZOLE 500 MG PO TABS: 2000.0000 mg | ORAL_TABLET | Freq: Once | ORAL | 0 refills | Status: AC

## 2016-08-03 NOTE — Assessment & Plan Note (Addendum)
Will recheck A1c today. May need to go ahead and start patient on metformin for prediabetes. Weight loss encouraged.

## 2016-08-03 NOTE — Assessment & Plan Note (Signed)
Doing well today. Will continue to work on weight loss. Smoking cessation counseling given. Blood work: lipids, A1c, BMP. Information given for patient to schedule mammogram. Pap smear performed today with STD testing. Flu vaccine given.

## 2016-08-03 NOTE — Telephone Encounter (Signed)
Pt was informed about lab results. ep

## 2016-08-03 NOTE — Telephone Encounter (Signed)
This was documented on results. Shaleena Crusoe,CMA

## 2016-08-03 NOTE — Assessment & Plan Note (Signed)
Lipid panel collect. Not on medical therapy. If continues to be elevated with normal lipid profile will discuss treatment benefits for treating triglycerides with patient.

## 2016-08-03 NOTE — Progress Notes (Signed)
Per telephone note, patient was made aware of test results. Jazmin Hartsell,CMA

## 2016-08-03 NOTE — Assessment & Plan Note (Signed)
Most likely premenopausal. Discussed this with patient. Return to clinic to discuss further in 2-3 weeks. Will obtain US at that time to check for any anatomical abnormalities causing irregular periods. Declined birth control to help with irregularity.

## 2016-08-03 NOTE — Assessment & Plan Note (Signed)
Continues to smoke but knows she needs to quit smoking. Not prepared to quit at this moment. Counseled on tobacco cessation.

## 2016-08-03 NOTE — Assessment & Plan Note (Signed)
BM! 3. DEiscussed weight loss and healthy lifestyle choices. Patient wants to lose weight and has set goals to help accomplish this.

## 2016-08-04 ENCOUNTER — Encounter: Payer: Self-pay | Admitting: Obstetrics and Gynecology

## 2016-08-16 ENCOUNTER — Encounter: Payer: Self-pay | Admitting: Internal Medicine

## 2016-08-16 ENCOUNTER — Ambulatory Visit (INDEPENDENT_AMBULATORY_CARE_PROVIDER_SITE_OTHER): Payer: 59 | Admitting: Internal Medicine

## 2016-08-16 ENCOUNTER — Telehealth: Payer: Self-pay | Admitting: Obstetrics and Gynecology

## 2016-08-16 VITALS — BP 124/68 | HR 86 | Temp 97.6°F | Wt 254.0 lb

## 2016-08-16 DIAGNOSIS — N926 Irregular menstruation, unspecified: Secondary | ICD-10-CM | POA: Diagnosis not present

## 2016-08-16 DIAGNOSIS — N921 Excessive and frequent menstruation with irregular cycle: Secondary | ICD-10-CM | POA: Diagnosis not present

## 2016-08-16 MED ORDER — MEDROXYPROGESTERONE ACETATE 10 MG PO TABS: 10.0000 mg | ORAL_TABLET | Freq: Every day | ORAL | 0 refills | Status: DC

## 2016-08-16 NOTE — Assessment & Plan Note (Addendum)
Given irregular menstrual bleeding with BMI >30, endometrial biopsy warranted. Was performed today and will follow up pathology results. Pelvic ultrasound ordered to check for anatomical abnormalities causing menorrhagia with irregular cycle. Rx for 10 day course of Progesterone given. Order CBC to ensure HgB stable. Precepted patient and developed plan with Dr. McDiarmid.

## 2016-08-16 NOTE — Telephone Encounter (Signed)
Return call to patient regarding heavy menstrual bleeding.  Review office note from Dr. Gerarda Fraction; patient to be seen in clinic, possible order an ultrasound.  Appointment scheduled with same day provider today at 2 PM.  Will forward to PCP and same day provider.  Derl Barrow, RN

## 2016-08-16 NOTE — Progress Notes (Signed)
   Subjective:    Erika Mckenzie - 45 y.o. female MRN [id number]  Date of birth: 03-May-1971  HPI  Erika Mckenzie is here for menorrhagia.  Menorrhagia: Present for approximately 2-3 months. Now with irregular cycle of bleeding every 2 weeks. Bleeding lasts about 8 days. Very heavy with passing lots of clots. She has been changing her pad about every 1-2 hours. LMP 08/15/16. She had to leave work yesterday due to heavy bleeding. Also having significant uterine cramping the past day. Has taken Tylenol #3 this morning. Denies previous history of irregular or heavy bleeding. Denies dysuria or vaginal discharge.   Age of first menses was 45 years old. Denies personal or family history of breast, ovarian, or endometrial cancer. Has two children. Used Depo-Provera for 10 years and then had BTL after the birth of her second child 13 years ago.   -  reports that she has been smoking Cigarettes.  She has been smoking about 0.50 packs per day. She does not have any smokeless tobacco history on file. - Review of Systems: Per HPI. - Past Medical History: Patient Active Problem List   Diagnosis Date Noted  . Irregular periods 08/03/2016  . Knee pain, right 05/20/2016  . Decreased hearing of right ear 12/28/2015  . Prediabetes 11/22/2013  . Hypertriglyceridemia 11/22/2013  . GERD (gastroesophageal reflux disease) 11/21/2013  . Bilateral lower extremity edema 06/25/2013  . Decreased libido 12/05/2012  . Carpal tunnel syndrome 03/20/2012  . Well woman exam 03/20/2012  . Muscle spasm of back 05/27/2011  . Migraine 10/26/2010  . ALLERGIC RHINITIS, SEASONAL 01/21/2009  . Obesity 12/07/2006  . TOBACCO DEPENDENCE 12/07/2006   - Medications: reviewed and updated   Objective:   Physical Exam BP 124/68   Pulse 86   Temp 97.6 F (36.4 C) (Oral)   Wt 254 lb (115.2 kg)   LMP 08/15/2016   BMI 42.27 kg/m  Gen: NAD, alert, cooperative with exam GU/GYN: Exam performed in the presence of a chaperone.  External genitalia within normal limits.  Vaginal mucosa pink, moist, normal rugae.  Nonfriable cervix without lesions. Bleeding from cervical os noted on exam.  Bimanual exam revealed normal, nongravid uterus.  No cervical motion tenderness.   PROCEDURE NOTE: Endometrial Biopsy Appropriate time out taken. The patient was placed in the lithotomy position and the cervix brought into view with sterile speculum.  The portion of cervix was cleansed with betadine swabs. A uterine sound was used to measure the uterus; uterus measured between 9-10 cm.. A pipelle was introduced  into the uterus, suction created,  and an endometrial sample was obtained. All equipment was removed and accounted for. The patient tolerated the procedure well. Continued bleeding was noted after the procedure. Patient given post procedure instructions. Sample sent to pathology.      Assessment & Plan:   Irregular periods Given irregular menstrual bleeding with BMI >30, endometrial biopsy warranted. Was performed today and will follow up pathology results. Pelvic ultrasound ordered to check for anatomical abnormalities causing menorrhagia with irregular cycle. Rx for 10 day course of Progesterone given. Order CBC to ensure HgB stable. Precepted patient and developed plan with Dr. McDiarmid.     Phill Myron, D.O. 08/17/2016, 11:13 AM PGY-2, [place]

## 2016-08-16 NOTE — Telephone Encounter (Signed)
Pt states she saw Dr. Gerarda Fraction recently about having very heavy menstrual cycles and pt is having another one now. Pt would like to know if there is something that could be called in or if she needs to come in. Please advise. Thanks! ep

## 2016-08-16 NOTE — Telephone Encounter (Signed)
Would recommend provider getting imaging. Also consider adding on a form of birth control to help with heavy menstruation.

## 2016-08-16 NOTE — Patient Instructions (Addendum)
Take the medication for 10 days in a row.   You can take NSAIDs (ibuprofen, naproxen) to help with bleeding and cramping.   We will call you with your results. Please follow up with Dr. Gerarda Fraction at your scheduled appointment.

## 2016-08-17 ENCOUNTER — Telehealth: Payer: Self-pay

## 2016-08-17 LAB — CBC
HCT: 32.9 % — ABNORMAL LOW (ref 35.0–45.0)
HEMOGLOBIN: 10.1 g/dL — AB (ref 11.7–15.5)
MCH: 23.2 pg — AB (ref 27.0–33.0)
MCHC: 30.7 g/dL — AB (ref 32.0–36.0)
MCV: 75.6 fL — ABNORMAL LOW (ref 80.0–100.0)
MPV: 9.6 fL (ref 7.5–12.5)
PLATELETS: 243 10*3/uL (ref 140–400)
RBC: 4.35 MIL/uL (ref 3.80–5.10)
RDW: 16.5 % — AB (ref 11.0–15.0)
WBC: 5.9 10*3/uL (ref 3.8–10.8)

## 2016-08-17 NOTE — Telephone Encounter (Signed)
Erika Bang, DO  P [facility]        Please call patient and let her know her Hemoglobin is low but still at a safe level. I would recommend that she start taking iron supplements that she can get OTC at her pharmacy to help improve blood counts.   Phill Myron, D.O.  08/17/2016, 8:52 AM  PGY-2, [place]

## 2016-08-17 NOTE — Telephone Encounter (Signed)
Pt informed of results. Nat Christen, CMA

## 2016-08-17 NOTE — Telephone Encounter (Deleted)
Called pt and informed her of the results. Nat Christen, CMA

## 2016-08-23 ENCOUNTER — Ambulatory Visit (HOSPITAL_COMMUNITY): Payer: 59 | Attending: Family Medicine

## 2016-08-30 ENCOUNTER — Encounter: Payer: Self-pay | Admitting: Obstetrics and Gynecology

## 2016-08-30 ENCOUNTER — Ambulatory Visit: Payer: 59 | Admitting: Obstetrics and Gynecology

## 2016-08-30 ENCOUNTER — Ambulatory Visit (INDEPENDENT_AMBULATORY_CARE_PROVIDER_SITE_OTHER): Payer: 59 | Admitting: Obstetrics and Gynecology

## 2016-08-30 VITALS — BP 121/70 | HR 80 | Temp 98.7°F | Wt 254.0 lb

## 2016-08-30 DIAGNOSIS — F17200 Nicotine dependence, unspecified, uncomplicated: Secondary | ICD-10-CM

## 2016-08-30 DIAGNOSIS — N926 Irregular menstruation, unspecified: Secondary | ICD-10-CM

## 2016-08-30 DIAGNOSIS — D649 Anemia, unspecified: Secondary | ICD-10-CM | POA: Diagnosis not present

## 2016-08-30 MED ORDER — VARENICLINE TARTRATE 1 MG PO TABS: 1.0000 mg | ORAL_TABLET | Freq: Two times a day (BID) | ORAL | 0 refills | Status: DC

## 2016-08-30 MED ORDER — ESTRADIOL 0.025 MG/24HR TD PTWK: 0.0250 mg | MEDICATED_PATCH | TRANSDERMAL | 12 refills | Status: DC

## 2016-08-30 NOTE — Progress Notes (Signed)
     Subjective: Chief Complaint  Patient presents with  . Menstrual Problem     HPI: Erika Mckenzie is a 45 y.o. presenting to clinic today to discuss the following:  #Irregular Peiords Following up for irregular menstrual bleeding Endometrial biopsy performed last visit normal Patient unable to get pelvic ultrasounds due to cost finishes progesterone therapy today No longer having bright red blood, just brown discharge Willing to start hormonal therapy to help control bleeding  #Tobacco use Wishes to quit smoking tobacco Smokes 3-4 cigarettes a day Requesting Chantix to help with tobacco cessation  #Anemia Hemoglobin at last visit 10.1 Patient asymptomatic Was told to start iron tablets  Health Maintenance: Up-to-date    ROS noted in HPI.  Past Medical, Surgical, Social, and Family History Reviewed & Updated per EMR.   Objective: BP 121/70   Pulse 80   Temp 98.7 F (37.1 C) (Oral)   Wt 254 lb (115.2 kg)   LMP 08/30/2016   SpO2 92%   BMI 42.27 kg/m  Vitals and nursing notes reviewed  Physical Exam  Constitutional: She is well-developed, well-nourished, and in no distress.  Cardiovascular: Normal rate and regular rhythm.   Pulmonary/Chest: Effort normal and breath sounds normal.  Abdominal: Soft. There is no tenderness.  Skin: Skin is warm and dry.  Psychiatric: Mood and affect normal.    Assessment/Plan: Please see problem based Assessment and Plan    Meds ordered this encounter  Medications  . estradiol (CLIMARA - DOSED IN MG/24 HR) 0.025 mg/24hr patch    Sig: Place 1 patch (0.025 mg total) onto the skin once a week.    Dispense:  4 patch    Refill:  12  . varenicline (CHANTIX CONTINUING MONTH PAK) 1 MG tablet    Sig: Take 1 tablet (1 mg total) by mouth 2 (two) times daily.    Dispense:  60 tablet    Refill:  [place], DO 08/30/2016, 4:55 PM PGY-3, [place]

## 2016-08-30 NOTE — Patient Instructions (Addendum)
Birth control patches prescribed. Start in a week Chantix sent to pharmacy. Set a quit date and then follow the below instructions:  Days 1 to 3: 0.5 mg once daily Days 4 to 7: 0.5 mg twice daily Maintenance (= Day 8): 1 mg twice daily   Call [facility]. Telephone Service is available 24/7 toll-free at. 1-800-QUIT-NOW (423)[phone].

## 2016-08-31 DIAGNOSIS — D649 Anemia, unspecified: Secondary | ICD-10-CM | POA: Insufficient documentation

## 2016-08-31 NOTE — Assessment & Plan Note (Signed)
Again most likely secondary to premenopause. Endometrial biopsy normal. Patient finished progesterone therapy with cessation of bleeding. Will start hormonal birth control at this time to help with irregular bleeding. Counseled patient on risk of clots and stroke given tobacco use and age. Patient accepts risk. Plans on quitting tobacco within the month. Rx for birth control patches given. Patient to obtain ultrasound at the beginning of the year when can afford it.

## 2016-08-31 NOTE — Assessment & Plan Note (Signed)
Continues to smoke. Plans on quitting before the new year. Tobacco Cessation counseling given. Rx for Chantix. Encouraged Patient to Set Quit Date. Quit line Information Given. Will Follow-Up Call Patient Next Week to see where she she is at in the Quitting Process.

## 2016-08-31 NOTE — Assessment & Plan Note (Signed)
Hemoglobin at last visit 10.1. MCV low. No iron studies obtained. Patient encouraged to start by mouth iron. Anemia most likely secondary to blood loss from irregular menstrual bleeding. Will recheck in a month.

## 2016-09-06 ENCOUNTER — Telehealth: Payer: Self-pay | Admitting: *Deleted

## 2016-09-06 NOTE — Telephone Encounter (Signed)
Prior Authorization received from [employer] for Chantix.  PA form placed in provider box for completion. Derl Barrow, RN

## 2016-09-07 ENCOUNTER — Other Ambulatory Visit: Payer: Self-pay | Admitting: Obstetrics and Gynecology

## 2016-09-07 NOTE — Telephone Encounter (Signed)
PA form faxed to [facility] for review.  Review process could take up to 5 business days.  Derl Barrow, RN

## 2016-09-07 NOTE — Telephone Encounter (Signed)
Returned to [employer]

## 2016-09-12 NOTE — Telephone Encounter (Signed)
PA for Chantix denied, fax with list alternatives placed in PCP box to review.

## 2016-09-19 NOTE — Telephone Encounter (Signed)
Pt Is calling to check the status of a different medication. [place], Salome Spotted, CMA

## 2016-09-20 MED ORDER — NICOTINE 7 MG/24HR TD PT24: 7.0000 mg | MEDICATED_PATCH | Freq: Every day | TRANSDERMAL | 0 refills | Status: DC

## 2016-09-20 NOTE — Telephone Encounter (Signed)
Will write script for alternative treatment. Patient to try the nicotine patches (sent to pharmacy). Insurance will not cover Chantix unless other forms have not been tolerated.

## 2016-09-20 NOTE — Addendum Note (Signed)
Addended by: Katheren Shams on: 09/20/2016 02:01 PM   Modules accepted: Orders

## 2016-09-20 NOTE — Telephone Encounter (Signed)
I called pt to inform her that Dr. Gerarda Fraction was going to be here this afternoon to address her rx request- I was unable to leave a VM.

## 2016-09-23 ENCOUNTER — Telehealth: Payer: Self-pay | Admitting: *Deleted

## 2016-09-23 NOTE — Telephone Encounter (Signed)
Prior Authorization received from [place] for Nicotine patch on 09/21/16. PA completed online at [email or web address].  PA pending per [facility].  Review could take 24-72 hours to complete.  Derl Barrow, RN

## 2016-09-27 NOTE — Telephone Encounter (Signed)
Called to check the status of prior authorization for Nicotine patch.  PA is still in the review process.  Derl Barrow, RN

## 2016-09-28 NOTE — Telephone Encounter (Signed)
PA for Nicotine patch approved via [employer].  Approval valid 09/27/16-09/20/17.  Reference number: [id number].  Derl Barrow, RN

## 2016-12-07 ENCOUNTER — Encounter: Payer: Self-pay | Admitting: Family Medicine

## 2016-12-07 ENCOUNTER — Ambulatory Visit (INDEPENDENT_AMBULATORY_CARE_PROVIDER_SITE_OTHER): Payer: 59 | Admitting: Family Medicine

## 2016-12-07 ENCOUNTER — Other Ambulatory Visit: Payer: Self-pay | Admitting: Obstetrics and Gynecology

## 2016-12-07 VITALS — BP 120/68 | HR 91 | Temp 98.6°F | Ht 65.0 in | Wt 254.0 lb

## 2016-12-07 DIAGNOSIS — B001 Herpesviral vesicular dermatitis: Secondary | ICD-10-CM | POA: Diagnosis not present

## 2016-12-07 MED ORDER — VALACYCLOVIR HCL 1 G PO TABS: 2000.0000 mg | ORAL_TABLET | Freq: Two times a day (BID) | ORAL | 0 refills | Status: AC

## 2016-12-07 NOTE — Progress Notes (Signed)
    Subjective:  Erika Mckenzie is a 46 y.o. female who presents to the [facility] today with a chief complaint of cold sore.   HPI:  Cold Sore Symptoms started 2 days ago. Lesion on her right lower lip. She does not have a history of cold sores, but says that her sister does. She has also had some swelling along her right neck. Mildly painful. Lesion is pruritic. No fevers. No dysphagia or difficulty sleeping. Tried an over the counter balm which did not help.   ROS: Per HPI  PMH: Smoking history reviewed.   Objective:  Physical Exam: BP 120/68   Pulse 91   Temp 98.6 F (37 C) (Oral)   Ht 5\' 5"  (1.651 m)   Wt 254 lb (115.2 kg)   LMP 12/07/2016   SpO2 99%   BMI 42.27 kg/m   Gen: NAD, resting comfortably HEENT:  -Lips: erythematous lesion approximately 1cm in diameter on right lower lip with overlying vesicles. -Neck: LAD noted in right submandibular area.  CV: RRR with no murmurs appreciated Pulm: NWOB, CTAB with no crackles, wheezes, or rhonchi MSK: no edema, cyanosis, or clubbing noted Skin: warm, dry Neuro: grossly normal, moves all extremities Psych: Normal affect and thought content  Assessment/Plan:  Cold sore Symptoms consistent with herpes labialis. Will treat with 1 day course of valtrex. Return precautions reviewed. Follow up as needed.   Algis Greenhouse. Jerline Pain, [place] Resident PGY-3 12/07/2016 11:13 AM

## 2016-12-07 NOTE — Assessment & Plan Note (Signed)
Symptoms consistent with herpes labialis. Will treat with 1 day course of valtrex. Return precautions reviewed. Follow up as needed.

## 2016-12-07 NOTE — Patient Instructions (Signed)
Cold Sore A cold sore, also called a fever blister, is a skin infection that is caused by a virus. This infection causes small, fluid-filled sores to form inside of the mouth or on the lips, gums, nose, chin, or cheeks. Cold sores can spread to other parts of the body, such as the eyes or fingers. Cold sores can be spread or passed from person to person (contagious) until the sores crust over completely. Cold sores can be spread through close contact, such as kissing or sharing a drinking glass. Follow these instructions at home: Medicines   Take or apply over-the-counter and prescription medicines only as told by your doctor.  Use a cotton-tip swab to apply creams or gels to your sores. Sore Care   Do not touch the sores or pick the scabs.  Wash your hands often. Do not touch your eyes without washing your hands first.  Keep the sores clean and dry.  If directed, apply ice to the sores:  Put ice in a plastic bag.  Place a towel between your skin and the bag.  Leave the ice on for 20 minutes, 2-3 times per day. Lifestyle   Do not kiss, have oral sex, or share personal items until your sores heal.  Eat a soft, bland diet. Avoid eating hot, cold, or salty foods. These can hurt your mouth.  Use a straw if it hurts to drink out of a glass.  Avoid the sun and limit your stress if these things trigger outbreaks. If sun causes cold sores, apply sunscreen on your lips before being out in the sun. Contact a doctor if:  You have symptoms for more than two weeks.  You have pus coming from the sores.  You have redness that is spreading.  You have pain or irritation in your eye.  You get sores on your genitals.  Your sores do not heal within two weeks.  You get cold sores often. Get help right away if:  You have a fever and your symptoms suddenly get worse.  You have a headache and confusion. This information is not intended to replace advice given to you by your health care  provider. Make sure you discuss any questions you have with your health care provider. Document Released: 03/27/2012 Document Revised: 03/03/2016 Document Reviewed: 07/17/2015 Elsevier Interactive Patient Education  2017 Elsevier Inc.  

## 2017-01-04 DIAGNOSIS — M170 Bilateral primary osteoarthritis of knee: Secondary | ICD-10-CM | POA: Diagnosis not present

## 2017-01-04 DIAGNOSIS — M25562 Pain in left knee: Secondary | ICD-10-CM | POA: Diagnosis not present

## 2017-01-04 DIAGNOSIS — M25462 Effusion, left knee: Secondary | ICD-10-CM | POA: Diagnosis not present

## 2017-01-04 DIAGNOSIS — M25561 Pain in right knee: Secondary | ICD-10-CM | POA: Diagnosis not present

## 2017-01-18 DIAGNOSIS — M25561 Pain in right knee: Secondary | ICD-10-CM | POA: Diagnosis not present

## 2017-01-18 DIAGNOSIS — M25562 Pain in left knee: Secondary | ICD-10-CM | POA: Diagnosis not present

## 2017-01-18 DIAGNOSIS — M170 Bilateral primary osteoarthritis of knee: Secondary | ICD-10-CM | POA: Diagnosis not present

## 2017-01-26 DIAGNOSIS — M1711 Unilateral primary osteoarthritis, right knee: Secondary | ICD-10-CM | POA: Diagnosis not present

## 2017-01-26 DIAGNOSIS — M25562 Pain in left knee: Secondary | ICD-10-CM | POA: Diagnosis not present

## 2017-01-26 DIAGNOSIS — R262 Difficulty in walking, not elsewhere classified: Secondary | ICD-10-CM | POA: Diagnosis not present

## 2017-01-26 DIAGNOSIS — M170 Bilateral primary osteoarthritis of knee: Secondary | ICD-10-CM | POA: Diagnosis not present

## 2017-01-26 DIAGNOSIS — M25561 Pain in right knee: Secondary | ICD-10-CM | POA: Diagnosis not present

## 2017-02-14 ENCOUNTER — Other Ambulatory Visit: Payer: Self-pay | Admitting: Family Medicine

## 2017-02-14 DIAGNOSIS — J301 Allergic rhinitis due to pollen: Secondary | ICD-10-CM

## 2017-02-27 ENCOUNTER — Ambulatory Visit (INDEPENDENT_AMBULATORY_CARE_PROVIDER_SITE_OTHER): Payer: 59 | Admitting: Student

## 2017-02-27 ENCOUNTER — Encounter: Payer: Self-pay | Admitting: Student

## 2017-02-27 VITALS — BP 103/71 | HR 88 | Temp 98.6°F | Wt 243.0 lb

## 2017-02-27 DIAGNOSIS — G43009 Migraine without aura, not intractable, without status migrainosus: Secondary | ICD-10-CM | POA: Diagnosis not present

## 2017-02-27 MED ORDER — SUMATRIPTAN SUCCINATE 50 MG PO TABS: ORAL_TABLET | ORAL | 3 refills | Status: DC

## 2017-02-27 MED ORDER — KETOROLAC TROMETHAMINE 30 MG/ML IJ SOLN
30.0000 mg | Freq: Once | INTRAMUSCULAR | Status: AC
  Administered 2017-02-27: 30 mg via INTRAMUSCULAR

## 2017-02-27 NOTE — Progress Notes (Signed)
   Subjective:    Patient ID: Erika Mckenzie, female    DOB: 05-13-1971, 46 y.o.   MRN: [id number]   CC: migraine headache  HPI: 46 y/o F presents for migraine headache  Migraine - started 3 days ago - she has been able to sleep through the headaches - she has been taking imitrex for this but only one at a time - no changes in vision or weakness - she does have photophobia associated - this is similar to her typical migraines - located over the front her her head - last had a migraine several months ago. - they are typically associated with her menses, Patient's last menstrual period was 02/22/2017.   Smoking status reviewed  Review of Systems  Per HPI, else denies recent illness, fever, chest pain, shortness of breath    Objective:  BP 103/71   Pulse 88   Temp 98.6 F (37 C) (Oral)   Wt 243 lb (110.2 kg)   LMP 02/22/2017   SpO2 99%   BMI 40.44 kg/m  Vitals and nursing note reviewed  General: NAD HEENT: EOMI Cardiac: RRR Respiratory: CTAB, normal effort Skin: warm and dry, no rashes noted Neuro: alert and oriented, no focal deficits   Assessment & Plan:    Migraine No red flags on history or exam - Toradol given in the office - imitrex refilled with use instructions - follow as needed    Lillianne Eick A. Lincoln Brigham MD, [facility] Resident PGY-3 Pager [phone]

## 2017-02-27 NOTE — Assessment & Plan Note (Signed)
No red flags on history or exam - Toradol given in the office - imitrex refilled with use instructions - follow as needed

## 2017-02-27 NOTE — Patient Instructions (Addendum)
Follow up with PCP as needed Take Sumatriptan then repeat in 2 hours if migraine not resolved Call the office with questions or concerns

## 2017-03-02 DIAGNOSIS — Z1231 Encounter for screening mammogram for malignant neoplasm of breast: Secondary | ICD-10-CM | POA: Diagnosis not present

## 2017-03-30 ENCOUNTER — Encounter: Payer: Self-pay | Admitting: Obstetrics and Gynecology

## 2017-04-26 ENCOUNTER — Ambulatory Visit: Payer: 59

## 2017-04-26 ENCOUNTER — Ambulatory Visit (INDEPENDENT_AMBULATORY_CARE_PROVIDER_SITE_OTHER): Payer: 59 | Admitting: Family Medicine

## 2017-04-26 VITALS — BP 122/80 | HR 92 | Temp 98.5°F | Ht 65.0 in | Wt 251.2 lb

## 2017-04-26 DIAGNOSIS — N924 Excessive bleeding in the premenopausal period: Secondary | ICD-10-CM | POA: Diagnosis not present

## 2017-04-26 DIAGNOSIS — N920 Excessive and frequent menstruation with regular cycle: Secondary | ICD-10-CM

## 2017-04-26 LAB — POCT HEMOGLOBIN: Hemoglobin: 9.3 g/dL — AB (ref 12.2–16.2)

## 2017-04-26 MED ORDER — IBUPROFEN 800 MG PO TABS: 800.0000 mg | ORAL_TABLET | Freq: Three times a day (TID) | ORAL | 0 refills | Status: AC | PRN

## 2017-04-26 MED ORDER — FERROUS SULFATE 325 (65 FE) MG PO TABS: 325.0000 mg | ORAL_TABLET | Freq: Every day | ORAL | 2 refills | Status: DC

## 2017-04-26 NOTE — Patient Instructions (Signed)
Thank you for coming in today, it was so nice to see you! Today we talked about:    Heavy menstrual bleeding; please take 800 mg of ibuprofen as prescribed on the bottle for the next 4 days. Take the iron pills as prescribed.   Please follow up in 1 week if you continue to have bleeding.   If you have any questions or concerns, please do not hesitate to call the office at [phone]. You can also message me directly via MyChart.   Sincerely,  Smitty Cords, MD     Menorrhagia Menorrhagia is when your menstrual periods are heavy or last longer than usual. Follow these instructions at home:  Only take medicine as told by your doctor.  Take any iron pills as told by your doctor. Heavy bleeding may cause low levels of iron in your body.  Do not take aspirin 1 week before or during your period. Aspirin can make the bleeding worse.  Lie down for a while if you change your tampon or pad more than once in 2 hours. This may help lessen the bleeding.  Eat a healthy diet and foods with iron. These foods include leafy green vegetables, meat, liver, eggs, and whole grain breads and cereals.  Do not try to lose weight. Wait until the heavy bleeding has stopped and your iron level is normal. Contact a doctor if:  You soak through a pad or tampon every 1 or 2 hours, and this happens every time you have a period.  You need to use pads and tampons at the same time because you are bleeding so much.  You need to change your pad or tampon during the night.  You have a period that lasts for more than 8 days.  You pass clots bigger than 1 inch (2.5 cm) wide.  You have irregular periods that happen more or less often than once a month.  You feel dizzy or pass out (faint).  You feel very weak or tired.  You feel short of breath or feel your heart is beating too fast when you exercise.  You feel sick to your stomach (nausea) and you throw up (vomit) while you are taking your  medicine.  You have watery poop (diarrhea) while you are taking your medicine.  You have any problems that may be related to the medicine you are taking. Get help right away if:  You soak through 4 or more pads or tampons in 2 hours.  You have any bleeding while you are pregnant. This information is not intended to replace advice given to you by your health care provider. Make sure you discuss any questions you have with your health care provider. Document Released: 07/05/2008 Document Revised: 03/03/2016 Document Reviewed: 03/28/2013 Elsevier Interactive Patient Education  2017 [employer].

## 2017-04-26 NOTE — Progress Notes (Signed)
Subjective:    Patient ID: Erika Mckenzie , female   DOB: 1970-10-27 , 46 y.o..   MRN: [id number]  HPI  Erika Mckenzie is here for  Chief Complaint  Patient presents with  . Menorrhagia    1. Heavy menstrual bleeding: Patient notes that she has had heavy menstrual bleeding particularly bad this cycle. Also admits to occasional pelvic cramping. She started her period 3 days ago and notes that she has needed about 8 pads throughout the day as they are soaked with blood. Notes that she did have some clots that had come out. She states that she has had occasional heavy menstrual periods in the past and even had an endometrial biopsy which was normal. She was advised previously to go on oral contraceptive pills but she did not want to do this. Additionally she was also advised to get a pelvic ultrasound but she stated she did not want to pay to get this. Denies any dizziness, nausea, vomiting, lightheadedness. Notes that she never took the iron pills that she was told to take for anemia. Denies being sexually active and also has a bilateral tubal ligation  Review of Systems: Per HPI.    Past Medical History: Patient Active Problem List   Diagnosis Date Noted  . Heavy menstrual bleeding 04/28/2017  . Cold sore 12/07/2016  . Anemia 08/31/2016  . Irregular periods 08/03/2016  . Knee pain, right 05/20/2016  . Decreased hearing of right ear 12/28/2015  . Prediabetes 11/22/2013  . Hypertriglyceridemia 11/22/2013  . GERD (gastroesophageal reflux disease) 11/21/2013  . Decreased libido 12/05/2012  . Carpal tunnel syndrome 03/20/2012  . Well woman exam 03/20/2012  . Muscle spasm of back 05/27/2011  . Migraine 10/26/2010  . ALLERGIC RHINITIS, SEASONAL 01/21/2009  . Obesity 12/07/2006  . TOBACCO DEPENDENCE 12/07/2006    Medications: reviewed and updated  Social Hx:  reports that she has been smoking Cigarettes.  She has been smoking about 0.50 packs per day. She has never used  smokeless tobacco.   Objective:   BP 122/80 (BP Location: Right Arm, Patient Position: Sitting, Cuff Size: Normal)   Pulse 92   Temp 98.5 F (36.9 C) (Oral)   Ht 5\' 5"  (1.651 m)   Wt 251 lb 3.2 oz (113.9 kg)   LMP 04/24/2017   SpO2 99%   BMI 41.80 kg/m  Physical Exam  Gen: NAD, alert, cooperative with exam, well-appearing Gastrointestinal: soft, non tender, non distended, bowel sounds present Psych: good insight, normal mood and affect  Results for orders placed or performed in visit on 04/26/17  POCT hemoglobin  Result Value Ref Range   Hemoglobin 9.3 (A) 12.2 - 16.2 g/dL    Assessment & Plan:  Heavy menstrual bleeding Heavy menstrual bleeding with irregular cycles, likely early menopause. Patient has been seen for this before and had a normal endometrial biopsy. She was unable to get a pelvic ultrasounds due to cost. She has also tried estrogen patches in the past but she noted that these did not help. Patient would likely benefit from a pelvic ultrasound that she is not agreeable today due to cost. Hemoglobin 9.3 today. Pregnancy test not performed as patient is status post bilateral tubal ligation. -Patient will take 800 mg of ibuprofen 3 times daily for the next 5 days -Discussed importance of taking iron supplementation, prescription was sent to her pharmacy -She will follow-up in one week if she continues to have bleeding   Orders Placed This Encounter  Procedures  .  POCT hemoglobin   Meds ordered this encounter  Medications  . ibuprofen (ADVIL,MOTRIN) 800 MG tablet    Sig: Take 1 tablet (800 mg total) by mouth every 8 (eight) hours as needed.    Dispense:  30 tablet    Refill:  0  . ferrous sulfate 325 (65 FE) MG tablet    Sig: Take 1 tablet (325 mg total) by mouth daily.    Dispense:  30 tablet    Refill:  2    Smitty Cords, MD [place], PGY-3

## 2017-04-28 DIAGNOSIS — N920 Excessive and frequent menstruation with regular cycle: Secondary | ICD-10-CM | POA: Insufficient documentation

## 2017-04-28 NOTE — Assessment & Plan Note (Addendum)
Heavy menstrual bleeding with irregular cycles, likely early menopause. Patient has been seen for this before and had a normal endometrial biopsy. She was unable to get a pelvic ultrasounds due to cost. She has also tried estrogen patches in the past but she noted that these did not help. Patient would likely benefit from a pelvic ultrasound that she is not agreeable today due to cost. Hemoglobin 9.3 today. Pregnancy test not performed as patient is status post bilateral tubal ligation. -Patient will take 800 mg of ibuprofen 3 times daily for the next 5 days -Discussed importance of taking iron supplementation, prescription was sent to her pharmacy -She will follow-up in one week if she continues to have bleeding

## 2017-06-22 ENCOUNTER — Encounter: Payer: Self-pay | Admitting: Internal Medicine

## 2017-06-22 ENCOUNTER — Ambulatory Visit
Admission: RE | Admit: 2017-06-22 | Discharge: 2017-06-22 | Disposition: A | Discharge status: Home / Self Care | Payer: 59 | Source: Ambulatory Visit | Attending: Family Medicine | Admitting: Family Medicine

## 2017-06-22 ENCOUNTER — Ambulatory Visit (INDEPENDENT_AMBULATORY_CARE_PROVIDER_SITE_OTHER): Payer: 59 | Admitting: Internal Medicine

## 2017-06-22 VITALS — BP 122/80 | HR 95 | Temp 98.8°F | Ht 65.0 in | Wt 245.2 lb

## 2017-06-22 DIAGNOSIS — G8929 Other chronic pain: Secondary | ICD-10-CM | POA: Diagnosis not present

## 2017-06-22 DIAGNOSIS — M25562 Pain in left knee: Principal | ICD-10-CM

## 2017-06-22 DIAGNOSIS — M179 Osteoarthritis of knee, unspecified: Secondary | ICD-10-CM | POA: Diagnosis not present

## 2017-06-22 DIAGNOSIS — M25561 Pain in right knee: Principal | ICD-10-CM

## 2017-06-22 MED ORDER — MELOXICAM 15 MG PO TABS: 15.0000 mg | ORAL_TABLET | Freq: Every day | ORAL | 0 refills | Status: DC

## 2017-06-22 NOTE — Assessment & Plan Note (Addendum)
Likely degenerative, as noted by Dr. Nori Riis during initial evaluation. Obesity also likely causing significant role. Originally only in R knee, now bilateral and continuing to worsen. Has received corticosteroid injections both at [facility] sports med (only once) and at [facility], but has not continued to follow up at either practice. Some symptomatic relief with Tylenol and Goody's. No noticeable abnormalities on physical exam, and no TTP. Patient able to walk, sit, and stand unassisted but does seem to be in pain with sitting and standing. Received MRI while at [facility] but not in our records. Will obtain Xrays for baseline then proceed with additional imaging if necessary. Will also begin trial of Mobic. Encouraged patient to call Sports Medicine to schedule another appointment, as she has already been seen there for this problem in the past.

## 2017-06-22 NOTE — Progress Notes (Signed)
Subjective:   Patient: Erika Mckenzie       Birthdate: [birth year]       MRN: [id number]      HPI  Erika Mckenzie is a 46 y.o. female presenting for same day appt for knee pain.   Knee pain Seen for this issue last in 05/2016. At that time was thought to be degenerative meniscal in origin per Dr. Verlon Au note. Options were discussed with patient, and she chose to proceed with corticosteroid injection with f/u. Discussed that if no improvement, she would need to proceed with imaging (Xray first then likely MRI). Also discussed that ultimately weight loss would be the most beneficial treatment. Patient did not follow up at [facility], but did start going to [facility]. She was receiving injections there and was prescribed Tramadol. She said she began menstruating for three months continually, and was told that it may be due to either the steroids or the Tramadol. As such, she stopped going to [facility]. Said that MRI was performed while she was being seen there but is not sure of the results. She was also seen at a [facility] a few times for her knee pain. Michela Pitcher they told her she had fluid on her knees and drained it a few times, but then stopped doing so.  Today, patient says pain has become bilateral and continued to worsen over the past year. It is not interfering with her job. 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 She takes Tylenol and Goody Powder daily which does help with symptoms. She has been trying to ride a bike for exercise which she thinks is helping some. She reports some cramping and muscle spasms in her thighs recently. Also reports swelling in her knees in addition to the pain.   Smoking status reviewed. Patient is current every day smoker.   Review of Systems See HPI.     Objective:  Physical Exam  Constitutional: She is oriented to person,  place, and time.  Obese female in NAD  HENT:  Head: Normocephalic and atraumatic.  Pulmonary/Chest: Effort normal. No respiratory distress.  Musculoskeletal:  Knees symmetrical without swelling, erythema, bony abnormalities, effusion, or TTP bilaterally. Able to walk, sit, and stand without assistance, though does take extra time getting off of the exam table.   Neurological: She is alert and oriented to person, place, and time.  Skin: Skin is warm and dry.  Psychiatric: Affect and judgment normal.      Assessment & Plan:  Chronic knee pain Likely degenerative, as noted by Dr. Nori Riis during initial evaluation. Obesity also likely causing significant role. Originally only in R knee, now bilateral and continuing to worsen. Has received corticosteroid injections both at [facility] sports med (only once) and at [facility], but has not continued to follow up at either practice. Some symptomatic relief with Tylenol and Goody's. No noticeable abnormalities on physical exam, and no TTP. Patient able to walk, sit, and stand unassisted but does seem to be in pain with sitting and standing. Received MRI while at [facility] but not in our records. Will obtain Xrays for baseline then proceed with additional imaging if necessary. Will also begin trial of Mobic. Encouraged patient to call Sports Medicine to schedule another appointment, as she has already been seen there for this problem in the past.  Adin Hector, MD, MPH PGY-3 [place] [facility] Pager [phone]

## 2017-06-22 NOTE — Patient Instructions (Addendum)
It was nice meeting you today Ms. Daughety!  Please go to [facility] at [address], [address] to have your knee xrays performed at your earliest convenience.   You can begin taking Mobic (meloxicam) one tablet once a day. Try taking this alone for a few days. If you are still having pain, you can take Tylenol along with it as needed. DO NOT take ibuprofen or similar medications (Motrin, Aleve, naproxen) while taking Mobic.   Please call the [facility] at [phone] to schedule an appointment at your earliest convenience.   If you have any questions or concerns, please feel free to call the clinic.   Be well,  Dr. Avon Gully

## 2017-06-30 ENCOUNTER — Ambulatory Visit (INDEPENDENT_AMBULATORY_CARE_PROVIDER_SITE_OTHER): Payer: 59 | Admitting: Family Medicine

## 2017-06-30 VITALS — BP 118/88 | Ht 64.5 in | Wt 245.0 lb

## 2017-06-30 DIAGNOSIS — M25562 Pain in left knee: Secondary | ICD-10-CM | POA: Diagnosis not present

## 2017-06-30 DIAGNOSIS — G8929 Other chronic pain: Secondary | ICD-10-CM

## 2017-06-30 DIAGNOSIS — M25561 Pain in right knee: Secondary | ICD-10-CM | POA: Diagnosis not present

## 2017-06-30 NOTE — Patient Instructions (Addendum)
[facility]  [address] [place] [phone]  Dr. Rhona Raider

## 2017-06-30 NOTE — Progress Notes (Signed)
Chief complaint: Bilateral knee pain, left worse than right, 2 years, acute on chronic pain 4 weeks  History of present illness: Erika Mckenzie is a 46 year old female who presents to the sports medicine office today for follow-up of bilateral knee pain. Last visit here in the sports medicine office was back in August 2017. At that time, her right knee was more bothersome than her left knee. Ultrasound the right knee was done in the office and showed suspicion of medial meniscal tearing. Cortisone injection was done in the office. She did follow up with [facility], saw Dr. Noemi Chapel. She reports that she has had aspiration of both of her knees, with subsequent cortisone injection of both of her knees. She reports that she did have repeat cortisone injections a few times. She reports that the cortisone injection and only give her relief for approximately 1 week.. She reports that fluid would continue to reoccur in her knee. She reports that she did get an MRI last fall, does not recall the results of that MRI. She reports that she was told that she "just had osteoarthritis and had to deal with it." She reports in the beginning of this year going to a local [facility] group, receiving viscosupplementation. She reports no improvement with this therapy. 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 She reports pain today in the anterior aspect of both of her knees, also reports pain in both medial and lateral joint lines of both of her knees. Reports the medial joint line of her left knee is most significant pain. Today, she rates pain at a 7 out of 10. She reports any type of prolonged sitting, standing, or squatting as aggravating factors. She does not report of any radiation of pain. She describes the pain as a sharp pain. She reports no radiation of pain. She has tried multiple medications, including  meloxicam, topical diclofenac, Aspercreme, Biofreeze, tramadol, all with no improvement in her symptoms. She was seen by her primary physician approximately a week ago, x-rays were repeated, which showed tricompartmental osteoarthritis, with slight valgus shifting. She was recommended to return here to the office for reevaluation. She was given a prescription for meloxicam, which she reports has only been slightly helpful. She does not report of any warmth, or erythema, ecchymosis, but does report continued effusion. He does report of popping, locking, and catching sensations in both of her knees. She does not report of any numbness or tingling in her lower extremities. She does not report of any hip pain bilaterally.  Her interval past medical history, family history, social history, and surgical history reviewed, unchanged. She does have history of prediabetes. She does report of current tobacco use.  Review of systems:  As stated above  Physical exam: Vital signs are reviewed and are documented in the chart Gen.: Alert, oriented, appears stated age, in no apparent distress HEENT: Moist oral mucosa Respiratory: Normal respirations, able to speak in full sentences Cardiac: Regular rate, distal pulses 2+ Integumentary: No rashes on visible skin:  Neurologic: Strength 5/5, sensation 2+ in bilateral lower extremities Psych: Normal affect, mood is described as good Musculoskeletal: Inspection of bilateral knees reveals slight suprapatellar effusion, no warmth, erythema, ecchymosis noted, no obvious deformity or muscle atrophy. She is tender palpation along inferior pole patella, as well as in both medial and lateral joint lines bilaterally, no tenderness to palpation over quadriceps  tendon or patellar tendon, Lachman, anterior drawer, valgus, and varus stress testing negative, McMurray positive for pain and crepitus bilaterally, range of motion bilaterally is from 0 to 120  Assessment and plan: 1.  Bilateral knee pain, left worse than right, does have x-ray conclusive of tricompartmental osteoarthritis, suspect additionally having degenerative meniscal tearing, most highly likely in left medial meniscus  Bilateral knee pain -Discussed with patient symptoms are most consistent with degenerative osteoarthritis, she most likely has a degenerative meniscal tear -Discussed with patient that she has exhausted all conservative measures, including cortisone injections, anti-inflammatory medications, physical therapy, viscosupplementation, all with no improvement in her symptoms -Discussed option of having her return to [facility] for re-evaluation, however, she reports she would rather see a different orthopedic group -Will have referral made to have her see Dr. Rhona Raider with [facility] -Discussed continued use of NSAIDs, she does ask for stronger medications than NSAIDs and tramadol, however, discussed it is clinic policy not to prescribe narcotic medications and we are not able to do that  Will have patient follow-up on as-needed basis.  Mort Sawyers, M.D. [place]

## 2017-07-01 NOTE — Addendum Note (Signed)
Addended byDorcas Mcmurray L on: 07/01/2017 01:37 PM   Modules accepted: Level of Service

## 2017-07-01 NOTE — Progress Notes (Addendum)
[facility]: Attending Note: I have reviewed the chart, discussed wit the Sports Medicine Fellow. I agree with assessment and treatment plan as detailed in the [place] note. She has significant disability and has tried multiple conservative measures.  We'll refer her back to orthopedics, she would like a different orthopedic group.  46 years of age is not ideal for TKR, but perhaps they will have other options.  They may wish to perform MRI looking for patellofemoral joint issues specifically as I think this is her main problem and this is not well seen on plain film imaging.  Referrals made Greater than 50% of our 25 minute office visit was spent in counseling and education regarding these issues.

## 2017-07-14 DIAGNOSIS — M1712 Unilateral primary osteoarthritis, left knee: Secondary | ICD-10-CM | POA: Diagnosis not present

## 2017-07-14 DIAGNOSIS — M1711 Unilateral primary osteoarthritis, right knee: Secondary | ICD-10-CM | POA: Diagnosis not present

## 2017-07-18 ENCOUNTER — Other Ambulatory Visit: Payer: Self-pay | Admitting: Internal Medicine

## 2017-08-20 ENCOUNTER — Other Ambulatory Visit: Payer: Self-pay | Admitting: Family Medicine

## 2017-09-04 ENCOUNTER — Ambulatory Visit (INDEPENDENT_AMBULATORY_CARE_PROVIDER_SITE_OTHER): Payer: 59

## 2017-09-04 DIAGNOSIS — Z23 Encounter for immunization: Secondary | ICD-10-CM | POA: Diagnosis not present

## 2017-10-13 ENCOUNTER — Encounter: Payer: Self-pay | Admitting: Family Medicine

## 2017-10-13 ENCOUNTER — Ambulatory Visit: Payer: 59 | Admitting: Family Medicine

## 2017-10-13 ENCOUNTER — Other Ambulatory Visit: Payer: Self-pay

## 2017-10-13 VITALS — BP 108/72 | HR 88 | Temp 98.5°F | Wt 240.0 lb

## 2017-10-13 DIAGNOSIS — G8929 Other chronic pain: Secondary | ICD-10-CM

## 2017-10-13 DIAGNOSIS — M25562 Pain in left knee: Secondary | ICD-10-CM | POA: Diagnosis not present

## 2017-10-13 DIAGNOSIS — N920 Excessive and frequent menstruation with regular cycle: Secondary | ICD-10-CM

## 2017-10-13 DIAGNOSIS — M25561 Pain in right knee: Secondary | ICD-10-CM | POA: Diagnosis not present

## 2017-10-13 LAB — POCT HEMOGLOBIN: HEMOGLOBIN: 9.6 g/dL — AB (ref 12.2–16.2)

## 2017-10-13 NOTE — Patient Instructions (Addendum)
Please continue taking iron daily. We have scheduled you for an ultrasound at [facility] to look at your uterus and see if you have fibroids causing your heavy bleeding. This appointment is on Tuesday 10/17/17. Please call them with any questions.  I have also referred you to OB/gyn for management of your heavy bleeding. You will receive a call to schedule this appointment. If you do not hear from our office in 1-2 weeks please call us to check on the status of your referral at [phone].  Please try tylenol every 6 hours for your knee pain, especially while you are at work. We can talk more about other medications after your heavy bleeding is taken care of. Heat or ice may also be helpful as well as getting a compression sleeve for your knees.  If you have questions or concerns please do not hesitate to call at [phone].  Lucila Maine, DO PGY-2, [facility] 10/13/2017 2:44 PM   Osteoarthritis Osteoarthritis is a type of arthritis that affects tissue that covers the ends of bones in joints (cartilage). Cartilage acts as a cushion between the bones and helps them move smoothly. Osteoarthritis results when cartilage in the joints gets worn down. Osteoarthritis is sometimes called "wear and tear" arthritis. Osteoarthritis is the most common form of arthritis. It often occurs in older people. It is a condition that gets worse over time (a progressive condition). Joints that are most often affected by this condition are in:  Fingers.  Toes.  Hips.  Knees.  Spine, including neck and lower back.  What are the causes? This condition is caused by age-related wearing down of cartilage that covers the ends of bones. What increases the risk? The following factors may make you more likely to develop this condition:  Older age.  Being overweight or obese.  Overuse of joints, such as in athletes.  Past injury of a joint.  Past surgery on a joint.  Family  history of osteoarthritis.  What are the signs or symptoms? The main symptoms of this condition are pain, swelling, and stiffness in the joint. The joint may lose its shape over time. Small pieces of bone or cartilage may break off and float inside of the joint, which may cause more pain and damage to the joint. Small deposits of bone (osteophytes) may grow on the edges of the joint. Other symptoms may include:  A grating or scraping feeling inside the joint when you move it.  Popping or creaking sounds when you move.  Symptoms may affect one or more joints. Osteoarthritis in a major joint, such as your knee or hip, can make it painful to walk or exercise. If you have osteoarthritis in your hands, you might not be able to grip items, twist your hand, or control small movements of your hands and fingers (fine motor skills). How is this diagnosed? This condition may be diagnosed based on:  Your medical history.  A physical exam.  Your symptoms.  X-rays of the affected joint(s).  Blood tests to rule out other types of arthritis.  How is this treated? There is no cure for this condition, but treatment can help to control pain and improve joint function. Treatment plans may include:  A prescribed exercise program that allows for rest and joint relief. You may work with a physical therapist.  A weight control plan.  Pain relief techniques, such as: ? Applying heat and cold to the joint. ? Electric pulses delivered to nerve endings under  the skin (transcutaneous electrical nerve stimulation, or TENS). ? Massage. ? Certain nutritional supplements.  NSAIDs or prescription medicines to help relieve pain.  Medicine to help relieve pain and inflammation (corticosteroids). This can be given by mouth (orally) or as an injection.  Assistive devices, such as a brace, wrap, splint, specialized glove, or cane.  Surgery, such as: ? An osteotomy. This is done to reposition the bones and  relieve pain or to remove loose pieces of bone and cartilage. ? Joint replacement surgery. You may need this surgery if you have very bad (advanced) osteoarthritis.  Follow these instructions at home: Activity  Rest your affected joints as directed by your health care provider.  Do not drive or use heavy machinery while taking prescription pain medicine.  Exercise as directed. Your health care provider or physical therapist may recommend specific types of exercise, such as: ? Strengthening exercises. These are done to strengthen the muscles that support joints that are affected by arthritis. They can be performed with weights or with exercise bands to add resistance. ? Aerobic activities. These are exercises, such as brisk walking or water aerobics, that get your heart pumping. ? Range-of-motion activities. These keep your joints easy to move. ? Balance and agility exercises. Managing pain, stiffness, and swelling  If directed, apply heat to the affected area as often as told by your health care provider. Use the heat source that your health care provider recommends, such as a moist heat pack or a heating pad. ? If you have a removable assistive device, remove it as told by your health care provider. ? Place a towel between your skin and the heat source. If your health care provider tells you to keep the assistive device on while you apply heat, place a towel between the assistive device and the heat source. ? Leave the heat on for 20-30 minutes. ? Remove the heat if your skin turns bright red. This is especially important if you are unable to feel pain, heat, or cold. You may have a greater risk of getting burned.  If directed, put ice on the affected joint: ? If you have a removable assistive device, remove it as told by your health care provider. ? Put ice in a plastic bag. ? Place a towel between your skin and the bag. If your health care provider tells you to keep the assistive device  on during icing, place a towel between the assistive device and the bag. ? Leave the ice on for 20 minutes, 2-3 times a day. General instructions  Take over-the-counter and prescription medicines only as told by your health care provider.  Maintain a healthy weight. Follow instructions from your health care provider for weight control. These may include dietary restrictions.  Do not use any products that contain nicotine or tobacco, such as cigarettes and e-cigarettes. These can delay bone healing. If you need help quitting, ask your health care provider.  Use assistive devices as directed by your health care provider.  Keep all follow-up visits as told by your health care provider. This is important. Where to find more information:  [employer] of Arthritis and Musculoskeletal and Skin Diseases: www.niams.SouthExposed.es  [employer] on Aging: http://kim-miller.com/  American College of Rheumatology: www.rheumatology.org Contact a health care provider if:  Your skin turns red.  You develop a rash.  You have pain that gets worse.  You have a fever along with joint or muscle aches. Get help right away if:  You lose a lot  of weight.  You suddenly lose your appetite.  You have night sweats. Summary  Osteoarthritis is a type of arthritis that affects tissue covering the ends of bones in joints (cartilage).  This condition is caused by age-related wearing down of cartilage that covers the ends of bones.  The main symptom of this condition is pain, swelling, and stiffness in the joint.  There is no cure for this condition, but treatment can help to control pain and improve joint function. This information is not intended to replace advice given to you by your health care provider. Make sure you discuss any questions you have with your health care provider. Document Released: 09/26/2005 Document Revised: 05/30/2016 Document Reviewed: 05/30/2016 Elsevier Interactive Patient  Education  [employer].

## 2017-10-13 NOTE — Assessment & Plan Note (Signed)
  Patient with longstanding history of menorrhagia failing several hormonal therapies, she has not gone to get pelvic US done despite being ordered several times. We have scheduled this for her on 1/8 at 1pm and I implored her to go to this appointment. Referral made to OB/gyn for further management. Avoid NSAIDs for OA. Continue oral iron supplementation. HgB stable at 9.6 today.

## 2017-10-13 NOTE — Assessment & Plan Note (Signed)
  Secondary to bilateral knee OA. Failed mobic, diclofenac, tramadol in past. Due to heavy menstrual bleeding will avoid NSAIDs at this time. Asked patient to try scheduled tylenol until menorrhagia better controlled. Supportive care including ice, heat, and knee braces. Encouraged weight loss. Will follow up in 1-2 months.

## 2017-10-13 NOTE — Progress Notes (Signed)
    Subjective:    Patient ID: Erika Mckenzie, female    DOB: 22-Feb-1971, 47 y.o.   MRN: [id number]   CC: heavy periods  HPI: went to donate blood and hemoglobin was 8. She has been having heavy periods for past several years. She has had several [place] ordered but never got one done. She is taking oral iron intermittently when she remembers to. Last period was 2 weeks ago, she bled for 10 days heavily through pads and clothes while at work. She has had estradiol patches before without improvement in bleeding. She has also had Provera tablets before that did not help bleeding. She is not currently on any hormonal medication. She does smoke 1/2 a pack a day. She has bilateral knee OA that she had taken Mobic and diclofenac in past for but that made bleeding worse. She now only takes tylenol pm to help her sleep at night due to pain in legs making it difficult to rest.   Smoking status reviewed- current smoker  Review of Systems- denies chest pain, palpitations, shortness of breath, fatigue. Endorses knee pain bilaterally with knee swelling. No fevers or chills.    Objective:  BP 108/72   Pulse 88   Temp 98.5 F (36.9 C) (Oral)   Wt 240 lb (108.9 kg)   LMP 09/23/2017   SpO2 99%   BMI 40.56 kg/m  Vitals and nursing note reviewed  General: well nourished, in no acute distress Cardiac: RRR, clear S1 and S2, no murmurs, rubs, or gallops Respiratory: clear to auscultation bilaterally, no increased work of breathing Abdomen: soft, nontender, nondistended, no masses or organomegaly. Bowel sounds present Extremities: no edema or cyanosis. Warm, well perfused. 2+ radial and PT pulses bilaterally Skin: warm and dry, no rashes noted Neuro: alert and oriented, no focal deficits   Assessment & Plan:    Heavy menstrual bleeding  Patient with longstanding history of menorrhagia failing several hormonal therapies, she has not gone to get pelvic US done despite being ordered several times. We have  scheduled this for her on 1/8 at 1pm and I implored her to go to this appointment. Referral made to OB/gyn for further management. Avoid NSAIDs for OA. Continue oral iron supplementation. HgB stable at 9.6 today.   Chronic knee pain  Secondary to bilateral knee OA. Failed mobic, diclofenac, tramadol in past. Due to heavy menstrual bleeding will avoid NSAIDs at this time. Asked patient to try scheduled tylenol until menorrhagia better controlled. Supportive care including ice, heat, and knee braces. Encouraged weight loss. Will follow up in 1-2 months.     Return if symptoms worsen or fail to improve.   Lucila Maine, DO Family Medicine Resident PGY-2

## 2017-10-17 ENCOUNTER — Telehealth: Payer: Self-pay | Admitting: *Deleted

## 2017-10-17 ENCOUNTER — Ambulatory Visit
Admission: RE | Admit: 2017-10-17 | Discharge: 2017-10-17 | Disposition: A | Discharge status: Home / Self Care | Payer: 59 | Source: Ambulatory Visit | Attending: Family Medicine | Admitting: Family Medicine

## 2017-10-17 ENCOUNTER — Other Ambulatory Visit: Payer: Self-pay | Admitting: Family Medicine

## 2017-10-17 DIAGNOSIS — N920 Excessive and frequent menstruation with regular cycle: Secondary | ICD-10-CM

## 2017-10-17 DIAGNOSIS — D259 Leiomyoma of uterus, unspecified: Secondary | ICD-10-CM | POA: Diagnosis not present

## 2017-10-17 NOTE — Telephone Encounter (Signed)
Fabby with [facility] called requesting VO to add Transvaginal U/S to Pelvic U/S. VO given per Dr. Erin Hearing. Hubbard Hartshorn, RN, BSN

## 2017-10-18 ENCOUNTER — Telehealth: Payer: Self-pay | Admitting: Family Medicine

## 2017-10-18 NOTE — Telephone Encounter (Signed)
Attempted to call Ms. Dauphinee again at work and home number, no answer. Results of [place] indicates she has fibroids which are benign but are likely causing her heavy bleeding, like we discussed at her visit. Her referral to Ob/gyn is in process. Since she has failed several different hormonal medications I do think it is best she follow up with them for further management and to discuss the need for hysterectomy. Will continue to try to reach her via phone.  Lucila Maine, DO PGY-2, [facility] 10/18/2017 1:56 PM

## 2017-10-18 NOTE — Telephone Encounter (Signed)
Pt had a procedure yesterday. She would like to talk to dr riccio about the results.

## 2017-10-18 NOTE — Telephone Encounter (Signed)
Returned call, left VM. Will try again later.

## 2017-10-18 NOTE — Telephone Encounter (Signed)
Will forward to MD. Tenae Graziosi,CMA  

## 2017-12-07 ENCOUNTER — Encounter: Payer: 59 | Admitting: Obstetrics & Gynecology

## 2018-01-09 ENCOUNTER — Telehealth: Payer: Self-pay | Admitting: *Deleted

## 2018-01-09 ENCOUNTER — Encounter: Payer: Self-pay | Admitting: Internal Medicine

## 2018-01-09 ENCOUNTER — Other Ambulatory Visit: Payer: Self-pay

## 2018-01-09 ENCOUNTER — Ambulatory Visit (INDEPENDENT_AMBULATORY_CARE_PROVIDER_SITE_OTHER): Payer: 59 | Admitting: Internal Medicine

## 2018-01-09 VITALS — BP 96/72 | HR 87 | Temp 98.3°F | Wt 243.2 lb

## 2018-01-09 DIAGNOSIS — N920 Excessive and frequent menstruation with regular cycle: Secondary | ICD-10-CM | POA: Diagnosis not present

## 2018-01-09 DIAGNOSIS — R42 Dizziness and giddiness: Secondary | ICD-10-CM

## 2018-01-09 LAB — CBC
Hematocrit: 33.4 % — ABNORMAL LOW (ref 34.0–46.6)
Hemoglobin: 11.2 g/dL (ref 11.1–15.9)
MCH: 23.5 pg — AB (ref 26.6–33.0)
MCHC: 33.5 g/dL (ref 31.5–35.7)
MCV: 70 fL — ABNORMAL LOW (ref 79–97)
PLATELETS: 224 10*3/uL (ref 150–379)
RBC: 4.77 x10E6/uL (ref 3.77–5.28)
RDW: 18.3 % — AB (ref 12.3–15.4)
WBC: 5.9 10*3/uL (ref 3.4–10.8)

## 2018-01-09 NOTE — Progress Notes (Signed)
   Subjective:    Erika Mckenzie - 47 y.o. female MRN [id number]  Date of birth: 1971-03-26  HPI  Erika Mckenzie is here for SDA for lightheadedness. Patient has a history of menorrhagia and has been seen several times at [facility] for this condition. Reports that she began to feel lightheaded this morning and had to leave work early to come here. Her menstrual period started two days ago. She went through 4 maxi pads in 6 hours this morning. Her last cycle ended less than 3 weeks prior to the start of this one. She asks about pelvic ultrasound results ordered by PCP. Denies chest pain, SOB, and LOC. Does endorse feeling of heart racing this morning at work. Has not consumed much fluids today.     -  reports that she has been smoking cigarettes.  She has been smoking about 0.50 packs per day. She has never used smokeless tobacco. - Review of Systems: Per HPI. - Past Medical History: Patient Active Problem List   Diagnosis Date Noted  . Heavy menstrual bleeding 04/28/2017  . Cold sore 12/07/2016  . Anemia 08/31/2016  . Irregular periods 08/03/2016  . Chronic knee pain 05/20/2016  . Decreased hearing of right ear 12/28/2015  . Prediabetes 11/22/2013  . Hypertriglyceridemia 11/22/2013  . GERD (gastroesophageal reflux disease) 11/21/2013  . Decreased libido 12/05/2012  . Carpal tunnel syndrome 03/20/2012  . Well woman exam 03/20/2012  . Muscle spasm of back 05/27/2011  . Migraine 10/26/2010  . ALLERGIC RHINITIS, SEASONAL 01/21/2009  . Obesity 12/07/2006  . TOBACCO DEPENDENCE 12/07/2006   - Medications: reviewed and updated   Objective:   Physical Exam BP 96/72   Pulse 87   Temp 98.3 F (36.8 C) (Oral)   Wt 243 lb 3.2 oz (110.3 kg)   LMP 01/07/2018 (Exact Date)   SpO2 100%   BMI 41.10 kg/m  Gen: NAD, alert, cooperative with exam HEENT: NCAT, PERRL, no conjunctival pallor  CV: RRR, good S1/S2, no murmur, no edema, capillary refill brisk  Resp: CTABL, no wheezes, non-labored     Assessment & Plan:   1. Menorrhagia with regular cycle Discussed findings of fibroids on ultrasound from 1/8. Given prolonged history of abnormal uterine bleeding, patient requests referral to Ob-GYN to discuss future management options.  - Ambulatory referral to Obstetrics / Gynecology - CBC  2. Lightheadedness Patient with long history of iron deficiency anemia from menorrhagia. She reports very poor compliance with Fe supplements. Last HgB 9.6. No other symptoms or signs concerning for significantly symptomatic anemia today. Will check CBC and have recommended better compliance with Fe. Also noted that while BP tends to run on the lower side, she is more hypotensive today. Reports poor fluid intake so recommended increasing volume. If remains lightheaded, will need further work up and management by PCP.  - CBC   Phill Myron, D.O. 01/09/2018, 2:01 PM PGY-3, [facility]

## 2018-01-09 NOTE — Patient Instructions (Signed)
We will call you with your blood counts. Start taking your iron supplement again. Drink lots of water today and get some rest. If you have chest pain, trouble breathing, heart won't stop racing, or you pass out you need to go the Emergency Room.   I have put in the referral to OB-GYN for your heavy periods and the fibroids seen on the ultrasound. Follow up with Dr. Vanetta Shawl if needed.    Take Care,   Dr. Juleen China

## 2018-01-09 NOTE — Telephone Encounter (Signed)
Pt lm on nurse line stating that she was returning a call to a provider.  No messages in chart.  Will forward to Dr. Juleen China who saw her today. Son Barkan, Salome Spotted, CMA

## 2018-01-10 NOTE — Telephone Encounter (Signed)
Attempted to call patient again without answer. Please call her with results from 4/1. Instructions noted above the results of CBC.   Phill Myron, D.O. 01/10/2018, 3:32 PM PGY-3, [place]

## 2018-01-10 NOTE — Telephone Encounter (Signed)
Will forward to Dr. Juleen China who saw patient most recently. Jazmin Hartsell,CMA

## 2018-01-10 NOTE — Telephone Encounter (Signed)
Pt called because she missed a call yesterday and would like a call back concerning her Lab results

## 2018-01-11 NOTE — Telephone Encounter (Signed)
PCLM for a return call. If pt calls, have her speak to a [employer] CMA. Ottis Stain, CMA

## 2018-01-11 NOTE — Telephone Encounter (Signed)
Spoke with and informed pt of results. Ottis Stain, CMA

## 2018-01-17 ENCOUNTER — Ambulatory Visit (INDEPENDENT_AMBULATORY_CARE_PROVIDER_SITE_OTHER): Payer: 59 | Admitting: Family Medicine

## 2018-01-17 ENCOUNTER — Other Ambulatory Visit: Payer: Self-pay

## 2018-01-17 ENCOUNTER — Encounter: Payer: Self-pay | Admitting: Family Medicine

## 2018-01-17 VITALS — BP 112/64 | HR 81 | Temp 98.0°F | Ht 65.0 in | Wt 250.0 lb

## 2018-01-17 DIAGNOSIS — M2241 Chondromalacia patellae, right knee: Secondary | ICD-10-CM | POA: Diagnosis not present

## 2018-01-17 DIAGNOSIS — M2242 Chondromalacia patellae, left knee: Secondary | ICD-10-CM

## 2018-01-17 MED ORDER — GABAPENTIN 100 MG PO CAPS: 100.0000 mg | ORAL_CAPSULE | Freq: Three times a day (TID) | ORAL | 0 refills | Status: DC

## 2018-01-17 NOTE — Patient Instructions (Signed)
  I have also referred you to Orthopedics for management of your knee pain. You will receive a call from our office to schedule this appointment. If you do not hear from our office in 1-2 weeks please call us to check on the status of your referral at [phone].   Please take 1-3 pills of gabapentin at night to help with pain and hopefully sleep.   If you have questions or concerns please do not hesitate to call at [phone].  Lucila Maine, DO PGY-2, [facility] 01/17/2018 4:00 PM

## 2018-01-17 NOTE — Progress Notes (Signed)
    Subjective:    Patient ID: Erika Mckenzie, female    DOB: 02-Oct-1971, 47 y.o.   MRN: [id number]   CC: bilateral leg pains  Patient reports worsening pains in legs over past 2 years. She has sought medical care at sports med and ortho offices with many interventions. She also has done flexogenics in the past without relief. She has been tolerating pain during the day but recently over past several weeks it is affecting her sleep and worse at rest. She reports moving around makes the pain better. Pain is worst when trying to stand up after sitting for long periods of time or walk up stairs.  Knee injections have lasted maybe a week. She has gotten roughly 4 injections in each knee (steroids) in the past.  Gabriel Earing powders work the best but not good for stomach and gyn bleeding. Tylenol no help She was told she is too young to do knee replacement at this time. She is mostly upset that pain is now interfering with sleep as she has to wake up at 5 for work.   Smoking status reviewed- current smoker  Review of Systems- denies fevers, chills. Endorses knee swelling. No rashes.    Objective:  BP 112/64   Pulse 81   Temp 98 F (36.7 C) (Oral)   Ht 5\' 5"  (1.651 m)   Wt 250 lb (113.4 kg)   LMP 01/07/2018 (Exact Date)   SpO2 97%   BMI 41.60 kg/m  Vitals and nursing note reviewed  General: well nourished, in no acute distress Cardiac: RRR, clear S1 and S2, no murmurs, rubs, or gallops Respiratory: clear to auscultation bilaterally, no increased work of breathing Extremities: no edema or cyanosis. Knees bilaterally with effusion. Significant patellar crepitus appreciated. Negative thessaly's.  Skin: warm and dry, no rashes noted Neuro: alert and oriented, no focal deficits   Assessment & Plan:    1. Chondromalacia of both patellae Reviewed past imaging, minimal arthritis appreciated. Precepted with Dr. Raynelle Bring (sports med fellow). At this time patient has failed virtually all conservative  measures including various anti-inflammatory medications, braces, knee injections with steroids and gel, bracing, rest. She has significant crepitus on exam and chondromalacia is very likely. Will refer to [facility] for evaluation for arthroscopy. Rx for gabapentin given to try to help with pain and sleep as this is the last non-controlled medication she has not tried. Follow up as needed. Patient verbalized understanding and agreement with plan. - Ambulatory referral to Orthopedics  Return if symptoms worsen or fail to improve.   Lucila Maine, DO Family Medicine Resident PGY-2

## 2018-02-01 ENCOUNTER — Encounter (INDEPENDENT_AMBULATORY_CARE_PROVIDER_SITE_OTHER): Payer: Self-pay | Admitting: Orthopedic Surgery

## 2018-02-01 ENCOUNTER — Ambulatory Visit (INDEPENDENT_AMBULATORY_CARE_PROVIDER_SITE_OTHER): Payer: 59

## 2018-02-01 ENCOUNTER — Encounter: Payer: Self-pay | Admitting: Obstetrics & Gynecology

## 2018-02-01 ENCOUNTER — Ambulatory Visit (INDEPENDENT_AMBULATORY_CARE_PROVIDER_SITE_OTHER): Payer: 59 | Admitting: Orthopedic Surgery

## 2018-02-01 DIAGNOSIS — M25561 Pain in right knee: Secondary | ICD-10-CM | POA: Diagnosis not present

## 2018-02-01 DIAGNOSIS — M25562 Pain in left knee: Secondary | ICD-10-CM | POA: Diagnosis not present

## 2018-02-01 NOTE — Progress Notes (Signed)
Office Visit Note   Patient: Erika Mckenzie           Date of Birth: Oct 28, 1970           MRN: [id number] Visit Date: 02/01/2018              Requested by: Steve Rattler, DO [place], [place] [address] PCP: Steve Rattler, DO  Chief Complaint  Patient presents with  . Left Knee - Pain  . Right Knee - Pain      HPI: Patient is a 47 year old woman who complains of bilateral knee pain right worse than left.  Patient complains of swelling popping crepitation with range of motion the patellofemoral joint.  She is to use gabapentin 3 times a day without relief.  She has been to a reflexogenic clinic she has had steroid injections hyaluronic acid injections without relief.  Assessment & Plan: Visit Diagnoses:  1. Right knee pain, unspecified chronicity   2. Left knee pain, unspecified chronicity     Plan: Discussed the importance of strengthening recommended going to the gym for strength training to improve the muscle strength around the knees.  Discussed that I do not feel that arthroscopic debridement would provide much relief she does not have mechanical catching locking or giving way.  Discussed that long-term options would include a total knee replacement but feels she is too young to proceed with the total knee at this time.  Discussed that she could try CBD lotion.  Follow-Up Instructions: Return if symptoms worsen or fail to improve.   Ortho Exam  Patient is alert, oriented, no adenopathy, well-dressed, normal affect, normal respiratory effort. Examination patient has an antalgic gait she is slow to get from a sitting to standing position.  Medial lateral joint lines are nontender to palpation collaterals and cruciates are stable she has crepitation in the patellofemoral joint with range of motion and most of her symptoms are in the patellofemoral joint and tender to palpation.  Imaging: Xr Knee 1-2 Views Left  Result Date: 02/01/2018 2 view radiographs of  the left knee shows medial joint space narrowing there is some mild periarticular bony spurs medially and laterally and some mild spurring of the patella joint.  Xr Knee 1-2 Views Right  Result Date: 02/01/2018 2 view radiographs of the right knee show some mild periarticular bony spurs there is minimal joint space narrowing.  Lateral radiograph shows mild spurring of the patella.  There are no subchondral cysts.  No images are attached to the encounter.  Labs: Lab Results  Component Value Date   HGBA1C 5.6 07/29/2016   HGBA1C 5.7 11/20/2013    @LABSALLVALUES (HGBA1)@  There is no height or weight on file to calculate BMI.  Orders:  Orders Placed This Encounter  Procedures  . XR Knee 1-2 Views Right  . XR Knee 1-2 Views Left   No orders of the defined types were placed in this encounter.    Procedures: No procedures performed  Clinical Data: No additional findings.  ROS:  All other systems negative, except as noted in the HPI. Review of Systems  Objective: Vital Signs: LMP 01/07/2018 (Exact Date)   Specialty Comments:  No specialty comments available.  PMFS History: Patient Active Problem List   Diagnosis Date Noted  . Heavy menstrual bleeding 04/28/2017  . Cold sore 12/07/2016  . Anemia 08/31/2016  . Irregular periods 08/03/2016  . Chronic knee pain 05/20/2016  . Decreased hearing of right ear 12/28/2015  .  Prediabetes 11/22/2013  . Hypertriglyceridemia 11/22/2013  . GERD (gastroesophageal reflux disease) 11/21/2013  . Decreased libido 12/05/2012  . Carpal tunnel syndrome 03/20/2012  . Well woman exam 03/20/2012  . Muscle spasm of back 05/27/2011  . Migraine 10/26/2010  . ALLERGIC RHINITIS, SEASONAL 01/21/2009  . Obesity 12/07/2006  . TOBACCO DEPENDENCE 12/07/2006   Past Medical History:  Diagnosis Date  . Migraines     No family history on file.  Past Surgical History:  Procedure Laterality Date  . TUBAL LIGATION     Social History    Occupational History  . Not on file  Tobacco Use  . Smoking status: Current Every Day Smoker    Packs/day: 0.50    Types: Cigarettes  . Smokeless tobacco: Never Used  Substance and Sexual Activity  . Alcohol use: Not on file  . Drug use: Not on file  . Sexual activity: Yes    Birth control/protection: Surgical

## 2018-02-11 ENCOUNTER — Other Ambulatory Visit: Payer: Self-pay | Admitting: Family Medicine

## 2018-02-18 ENCOUNTER — Other Ambulatory Visit: Payer: Self-pay | Admitting: Obstetrics and Gynecology

## 2018-02-18 DIAGNOSIS — J301 Allergic rhinitis due to pollen: Secondary | ICD-10-CM

## 2018-03-07 ENCOUNTER — Telehealth: Payer: Self-pay | Admitting: Obstetrics & Gynecology

## 2018-03-07 DIAGNOSIS — N939 Abnormal uterine and vaginal bleeding, unspecified: Secondary | ICD-10-CM

## 2018-03-07 DIAGNOSIS — N946 Dysmenorrhea, unspecified: Secondary | ICD-10-CM

## 2018-03-07 MED ORDER — IBUPROFEN 800 MG PO TABS: 800.0000 mg | ORAL_TABLET | Freq: Three times a day (TID) | ORAL | 0 refills | Status: DC | PRN

## 2018-03-07 MED ORDER — MEGESTROL ACETATE 40 MG PO TABS: 40.0000 mg | ORAL_TABLET | Freq: Every day | ORAL | 0 refills | Status: DC

## 2018-03-07 NOTE — Telephone Encounter (Signed)
Patient called about bleeding and passing clots. Also in pain. Requesting a call back from RN.

## 2018-03-07 NOTE — Telephone Encounter (Signed)
Called patient and she states she has an appt next week with [place] on the 6th but Sunday she started her period and she has been bleeding heavy and clotting real bad since then. Patient reports severe cramping the past couple of days. Per Dr Hulan Fray, may prescribe ibuprofen 800mg  and megace 40mg . Discussed medications with patient. Patient verbalized understanding & states if they don't work and she isn't feeling better by tomorrow she will come to MAU. Patient had no other questions.

## 2018-03-15 ENCOUNTER — Ambulatory Visit (INDEPENDENT_AMBULATORY_CARE_PROVIDER_SITE_OTHER): Payer: 59 | Admitting: Obstetrics & Gynecology

## 2018-03-15 ENCOUNTER — Encounter: Payer: Self-pay | Admitting: Obstetrics & Gynecology

## 2018-03-15 VITALS — BP 123/74 | HR 98 | Wt 247.9 lb

## 2018-03-15 DIAGNOSIS — N920 Excessive and frequent menstruation with regular cycle: Secondary | ICD-10-CM

## 2018-03-15 DIAGNOSIS — Z23 Encounter for immunization: Secondary | ICD-10-CM | POA: Diagnosis not present

## 2018-03-15 DIAGNOSIS — Z1231 Encounter for screening mammogram for malignant neoplasm of breast: Secondary | ICD-10-CM

## 2018-03-15 NOTE — Progress Notes (Signed)
Patient ID: Erika Mckenzie, female   DOB: 01-14-1971, 47 y.o.   MRN: [id number]  Chief Complaint  Patient presents with  . Fibroids    HPI Erika Mckenzie is a 47 y.o. female.  Separated P2 ( 14 and 24 daughters) here today with the issue of heavy vaginal bleeding, monthly, lasts about 8-10 days per month. She called in last week and we called in megace BID. This has helped with the pain and bleeding "slowed it up a lot". She had an u/s done at [facility] 1/19 that showed 2 small fibroids ( 2 cm each). She had this problem for at least 2 years, had a negative EMBX in 2017 by her fam med doc.   HPI  Past Medical History:  Diagnosis Date  . Migraines     Past Surgical History:  Procedure Laterality Date  . TUBAL LIGATION      No family history on file.  Social History Social History   Tobacco Use  . Smoking status: Current Every Day Smoker    Packs/day: 0.50    Types: Cigarettes  . Smokeless tobacco: Never Used  Substance Use Topics  . Alcohol use: Not on file  . Drug use: Not on file    Allergies  Allergen Reactions  . Tramadol Hives    Current Outpatient Medications  Medication Sig Dispense Refill  . cetirizine (ZYRTEC) 10 MG tablet TAKE 1 TABLET BY MOUTH DAILY 30 tablet 0  . ferrous sulfate 325 (65 FE) MG tablet Take 1 tablet (325 mg total) by mouth daily. 30 tablet 2  . fluticasone (FLONASE) 50 MCG/ACT nasal spray Place 2 sprays into both nostrils daily. 16 g 6  . ibuprofen (ADVIL,MOTRIN) 800 MG tablet Take 1 tablet (800 mg total) by mouth every 8 (eight) hours as needed. 30 tablet 0  . megestrol (MEGACE) 40 MG tablet Take 1 tablet (40 mg total) by mouth daily. 30 tablet 0  . SUMAtriptan (IMITREX) 50 MG tablet TAKE 1 TABLET BY MOUTH EVERY 2 HOURS AS NEEDED FOR MIGRAINE 30 tablet 3   No current facility-administered medications for this visit.     Review of Systems Review of Systems She has had a BTL about 14 years. Monogamous for about 9 months Works  at [employer]  Blood pressure 123/74, pulse 98, weight 247 lb 14.4 oz (112.4 kg), last menstrual period 03/04/2018.  Physical Exam Physical Exam  Breathing, conversing, and ambulating normally Well nourished, well hydrated Black female, no apparent distress Abd- benign, obese   Data Reviewed   Assessment    Menorrhagia with anemia    Plan    Offered megace prn, Mirena, endometrial ablation with d&c Rec that she continue iron daily She will need a EMBX       Kanna Dafoe C [place]efe 03/15/2018, 10:34 AM

## 2018-03-16 ENCOUNTER — Telehealth: Payer: Self-pay

## 2018-03-16 ENCOUNTER — Other Ambulatory Visit: Payer: Self-pay | Admitting: Obstetrics & Gynecology

## 2018-03-16 DIAGNOSIS — Z1231 Encounter for screening mammogram for malignant neoplasm of breast: Secondary | ICD-10-CM

## 2018-03-16 LAB — CBC
HEMATOCRIT: 33.3 % — AB (ref 34.0–46.6)
HEMOGLOBIN: 10.8 g/dL — AB (ref 11.1–15.9)
MCH: 24.1 pg — AB (ref 26.6–33.0)
MCHC: 32.4 g/dL (ref 31.5–35.7)
MCV: 74 fL — AB (ref 79–97)
PLATELETS: 222 10*3/uL (ref 150–450)
RBC: 4.48 x10E6/uL (ref 3.77–5.28)
RDW: 17.4 % — ABNORMAL HIGH (ref 12.3–15.4)
WBC: 5.6 10*3/uL (ref 3.4–10.8)

## 2018-03-16 LAB — TSH: TSH: 1.5 u[IU]/mL (ref 0.450–4.500)

## 2018-03-16 NOTE — Progress Notes (Signed)
Mammogram scheduled for June 28th @ 1550.

## 2018-03-16 NOTE — Telephone Encounter (Signed)
LM on pt VM, permitted by pt,  that her mammogram appt is scheduled for June 28th @ 1550 @ the [facility] and if she has any questions to please call the office.

## 2018-04-06 ENCOUNTER — Ambulatory Visit
Admission: RE | Admit: 2018-04-06 | Discharge: 2018-04-06 | Disposition: A | Discharge status: Home / Self Care | Payer: 59 | Source: Ambulatory Visit | Attending: Obstetrics & Gynecology | Admitting: Obstetrics & Gynecology

## 2018-04-06 DIAGNOSIS — Z1231 Encounter for screening mammogram for malignant neoplasm of breast: Secondary | ICD-10-CM | POA: Diagnosis not present

## 2018-04-09 ENCOUNTER — Other Ambulatory Visit (HOSPITAL_COMMUNITY)
Admission: RE | Admit: 2018-04-09 | Discharge: 2018-04-09 | Disposition: A | Discharge status: Home / Self Care | Payer: 59 | Source: Ambulatory Visit | Attending: Obstetrics & Gynecology | Admitting: Obstetrics & Gynecology

## 2018-04-09 ENCOUNTER — Encounter: Payer: Self-pay | Admitting: Obstetrics & Gynecology

## 2018-04-09 ENCOUNTER — Ambulatory Visit (INDEPENDENT_AMBULATORY_CARE_PROVIDER_SITE_OTHER): Payer: 59 | Admitting: Obstetrics & Gynecology

## 2018-04-09 VITALS — BP 132/78 | HR 89 | Ht 63.0 in | Wt 248.0 lb

## 2018-04-09 DIAGNOSIS — N938 Other specified abnormal uterine and vaginal bleeding: Secondary | ICD-10-CM | POA: Diagnosis not present

## 2018-04-09 DIAGNOSIS — N920 Excessive and frequent menstruation with regular cycle: Secondary | ICD-10-CM | POA: Diagnosis not present

## 2018-04-09 DIAGNOSIS — Z30430 Encounter for insertion of intrauterine contraceptive device: Secondary | ICD-10-CM | POA: Diagnosis not present

## 2018-04-09 DIAGNOSIS — Z3202 Encounter for pregnancy test, result negative: Secondary | ICD-10-CM | POA: Diagnosis not present

## 2018-04-09 LAB — POCT PREGNANCY, URINE: Preg Test, Ur: NEGATIVE

## 2018-04-09 MED ORDER — MEGESTROL ACETATE 40 MG PO TABS: 40.0000 mg | ORAL_TABLET | Freq: Two times a day (BID) | ORAL | 5 refills | Status: DC

## 2018-04-09 MED ORDER — LEVONORGESTREL 19.5 MCG/DAY IU IUD
INTRAUTERINE_SYSTEM | Freq: Once | INTRAUTERINE | Status: AC
  Administered 2018-04-09: 1 via INTRAUTERINE

## 2018-04-09 NOTE — Progress Notes (Signed)
   Subjective:    Patient ID: Erika Mckenzie, female    DOB: November 26, 1970, 47 y.o.   MRN: [id number]  HPI  47 yo separated P2 here for endometrial biopsy and insertion of Mirena to help with her DUB.  Review of Systems     Objective:   Physical Exam  Breathing, conversing, and ambulating normally Well nourished, well hydrated Black female, no apparent distress  UPT negative, consent signed, time out done Cervix prepped with betadine and grasped with a single tooth tenaculum Uterus sounded to 10 cm Pipelle used for 2 passes with a moderate amount of tissue obtained.  Cervix prepped with betadine and grasped with a single tooth tenaculum. Lilettawas easily placed and the strings were cut to 3-4 cm. Uterus sounded to 9 cm. She tolerated the procedures well. I gave her IBU 600 mg at the end of the procedures.     Assessment & Plan:  Come back for string check in 4 weeks Megace called in for prn use per patient request Rec back method for contraception for 2 weeks

## 2018-04-09 NOTE — Addendum Note (Signed)
Addended by: Shelly Coss on: 04/09/2018 09:10 AM   Modules accepted: Orders

## 2018-04-09 NOTE — Addendum Note (Signed)
Addended by: Shelly Coss on: 04/09/2018 09:08 AM   Modules accepted: Orders

## 2018-04-16 ENCOUNTER — Encounter: Payer: Self-pay | Admitting: *Deleted

## 2018-04-20 ENCOUNTER — Encounter: Payer: Self-pay | Admitting: *Deleted

## 2018-04-23 DIAGNOSIS — H1045 Other chronic allergic conjunctivitis: Secondary | ICD-10-CM | POA: Diagnosis not present

## 2018-04-26 DIAGNOSIS — H1045 Other chronic allergic conjunctivitis: Secondary | ICD-10-CM | POA: Diagnosis not present

## 2018-04-27 ENCOUNTER — Encounter: Payer: Self-pay | Admitting: Obstetrics & Gynecology

## 2018-04-30 ENCOUNTER — Encounter: Payer: Self-pay | Admitting: Obstetrics & Gynecology

## 2018-04-30 ENCOUNTER — Other Ambulatory Visit: Payer: Self-pay | Admitting: General Practice

## 2018-04-30 DIAGNOSIS — Z975 Presence of (intrauterine) contraceptive device: Principal | ICD-10-CM

## 2018-04-30 DIAGNOSIS — N921 Excessive and frequent menstruation with irregular cycle: Secondary | ICD-10-CM

## 2018-04-30 MED ORDER — NORGESTREL-ETHINYL ESTRADIOL 0.3-30 MG-MCG PO TABS
ORAL_TABLET | ORAL | 1 refills | Status: DC
Start: 2018-04-30 — End: 2018-05-23

## 2018-05-16 ENCOUNTER — Inpatient Hospital Stay (HOSPITAL_COMMUNITY)
Admission: AD | Admit: 2018-05-16 | Discharge: 2018-05-17 | Disposition: A | Discharge status: Home / Self Care | Payer: 59 | Source: Ambulatory Visit | Attending: Obstetrics & Gynecology | Admitting: Obstetrics & Gynecology

## 2018-05-16 ENCOUNTER — Encounter (HOSPITAL_COMMUNITY): Payer: Self-pay

## 2018-05-16 DIAGNOSIS — D649 Anemia, unspecified: Secondary | ICD-10-CM

## 2018-05-16 DIAGNOSIS — F17210 Nicotine dependence, cigarettes, uncomplicated: Secondary | ICD-10-CM | POA: Insufficient documentation

## 2018-05-16 DIAGNOSIS — T8389XA Other specified complication of genitourinary prosthetic devices, implants and grafts, initial encounter: Secondary | ICD-10-CM | POA: Insufficient documentation

## 2018-05-16 DIAGNOSIS — G43909 Migraine, unspecified, not intractable, without status migrainosus: Secondary | ICD-10-CM | POA: Insufficient documentation

## 2018-05-16 DIAGNOSIS — T839XXA Unspecified complication of genitourinary prosthetic device, implant and graft, initial encounter: Secondary | ICD-10-CM

## 2018-05-16 DIAGNOSIS — N939 Abnormal uterine and vaginal bleeding, unspecified: Secondary | ICD-10-CM | POA: Insufficient documentation

## 2018-05-16 DIAGNOSIS — N946 Dysmenorrhea, unspecified: Secondary | ICD-10-CM | POA: Insufficient documentation

## 2018-05-16 DIAGNOSIS — L299 Pruritus, unspecified: Secondary | ICD-10-CM | POA: Diagnosis not present

## 2018-05-16 LAB — CBC
HCT: 32.7 % — ABNORMAL LOW (ref 36.0–46.0)
Hemoglobin: 10.9 g/dL — ABNORMAL LOW (ref 12.0–15.0)
MCH: 26 pg (ref 26.0–34.0)
MCHC: 33.3 g/dL (ref 30.0–36.0)
MCV: 78 fL (ref 78.0–100.0)
PLATELETS: 192 10*3/uL (ref 150–400)
RBC: 4.19 MIL/uL (ref 3.87–5.11)
RDW: 18.1 % — AB (ref 11.5–15.5)
WBC: 7.1 10*3/uL (ref 4.0–10.5)

## 2018-05-16 MED ORDER — MEGESTROL ACETATE 40 MG PO TABS: ORAL_TABLET | ORAL | 3 refills | Status: DC

## 2018-05-16 MED ORDER — KETOROLAC TROMETHAMINE 60 MG/2ML IM SOLN
60.0000 mg | Freq: Once | INTRAMUSCULAR | Status: AC
  Administered 2018-05-16: 60 mg via INTRAMUSCULAR
  Filled 2018-05-16: qty 2

## 2018-05-16 NOTE — MAU Note (Signed)
Pt states she had an IUD placed on 04/09/2018. States tonight it came out. States she has it with her. States she has had cramping and heavy bleeding throughout the day and passing clots. States she has gone through 6 pads today and passed clots the size of a plum.

## 2018-05-16 NOTE — MAU Provider Note (Signed)
Chief Complaint:  No chief complaint on file.   First Provider Initiated Contact with Patient 05/16/18 2233      HPI: Erika Mckenzie is a 47 y.o. who presents to maternity admissions reporting heavy bleeding and cramping after expelling her IUD tonight.  Had started bleeding and cramping earlier in the day and then IUD came out.  It was placed 6 days ago for AUB.Marland Kitchen She reports vaginal bleeding, vaginal itching/burning, urinary symptoms, h/a, dizziness, n/v, or fever/chills.    Vaginal Bleeding  The patient's primary symptoms include pelvic pain and vaginal bleeding. The patient's pertinent negatives include no genital itching, genital lesions or genital odor. This is a new problem. The current episode started today. The problem occurs constantly. The problem has been unchanged. The pain is moderate. She is not pregnant. Associated symptoms include abdominal pain. Pertinent negatives include no chills, constipation, diarrhea, dysuria, fever, nausea or vomiting. The vaginal discharge was bloody. The vaginal bleeding is heavier than menses. She has been passing clots. She has not been passing tissue. Nothing aggravates the symptoms. She has tried nothing for the symptoms. She uses an IUD and tubal ligation for contraception.   RN Note: Pt states she had an IUD placed on 04/09/2018. States tonight it came out. States she has it with her. States she has had cramping and heavy bleeding throughout the day and passing clots. States she has gone through 6 pads today and passed clots the size of a plum  Past Medical History: Past Medical History:  Diagnosis Date  . Migraines     Past obstetric history: OB History  No data available    Past Surgical History: Past Surgical History:  Procedure Laterality Date  . TUBAL LIGATION      Family History: No family history on file.  Social History: Social History   Tobacco Use  . Smoking status: Current Every Day Smoker    Packs/day: 0.50    Types:  Cigarettes  . Smokeless tobacco: Never Used  Substance Use Topics  . Alcohol use: Not on file  . Drug use: Not on file    Allergies:  Allergies  Allergen Reactions  . Tramadol Hives    Meds:  Medications Prior to Admission  Medication Sig Dispense Refill Last Dose  . cetirizine (ZYRTEC) 10 MG tablet TAKE 1 TABLET BY MOUTH DAILY 30 tablet 0 Taking  . ferrous sulfate 325 (65 FE) MG tablet Take 1 tablet (325 mg total) by mouth daily. 30 tablet 2 Taking  . fluticasone (FLONASE) 50 MCG/ACT nasal spray Place 2 sprays into both nostrils daily. 16 g 6 Taking  . ibuprofen (ADVIL,MOTRIN) 800 MG tablet Take 1 tablet (800 mg total) by mouth every 8 (eight) hours as needed. 30 tablet 0 Taking  . megestrol (MEGACE) 40 MG tablet Take 1 tablet (40 mg total) by mouth daily. 30 tablet 0 Taking  . megestrol (MEGACE) 40 MG tablet Take 1 tablet (40 mg total) by mouth 2 (two) times daily. 60 tablet 5   . norgestrel-ethinyl estradiol (LO/OVRAL,CRYSELLE) 0.3-30 MG-MCG tablet Take 1 tablet daily until bleeding stops then can be used prn for bleeding 1 Package 1   . SUMAtriptan (IMITREX) 50 MG tablet TAKE 1 TABLET BY MOUTH EVERY 2 HOURS AS NEEDED FOR MIGRAINE 30 tablet 3 Taking    I have reviewed patient's Past Medical Hx, Surgical Hx, Family Hx, Social Hx, medications and allergies.  ROS:  Review of Systems  Constitutional: Negative for chills and fever.  Gastrointestinal: Positive for  abdominal pain. Negative for constipation, diarrhea, nausea and vomiting.  Genitourinary: Positive for pelvic pain and vaginal bleeding. Negative for dysuria.   Other systems negative     Physical Exam   Patient Vitals for the past 24 hrs:  BP Temp Temp src Pulse Resp SpO2 Height Weight  05/16/18 2204 124/61 98 F (36.7 C) Oral 69 18 100 % 5\' 4"  (1.626 m) 257 lb (116.6 kg)   Constitutional: Well-developed, well-nourished female in no acute distress.  Cardiovascular: normal rate and rhythm, no ectopy audible, S1 &  S2 heard, no murmur Respiratory: normal effort, no distress. Lungs CTAB with no wheezes or crackles GI: Abd soft, non-tender.  Nondistended.  No rebound, No guarding.   MS: Extremities nontender, no edema, normal ROM Neurologic: Alert and oriented x 4.   Grossly nonfocal. GU: Neg CVAT. Skin:  Warm and Dry Psych:  Affect appropriate.  PELVIC EXAM: small amount of bleeding now.  Uterus tender, normal size, No cervical motion tenderness.    Labs: Results for orders placed or performed during the hospital encounter of 05/16/18 (from the past 24 hour(s))  CBC     Status: Abnormal   Collection Time: 05/16/18 10:37 PM  Result Value Ref Range   WBC 7.1 4.0 - 10.5 K/uL   RBC 4.19 3.87 - 5.11 MIL/uL   Hemoglobin 10.9 (L) 12.0 - 15.0 g/dL   HCT 32.7 (L) 36.0 - 46.0 %   MCV 78.0 78.0 - 100.0 fL   MCH 26.0 26.0 - 34.0 pg   MCHC 33.3 30.0 - 36.0 g/dL   RDW 18.1 (H) 11.5 - 15.5 %   Platelets 192 150 - 400 K/uL      Imaging:  No results found.  MAU Course/MDM: I have ordered labs as follows: CBC to assess blood loss.  Reassured pt that her Hgb is stable Imaging ordered: none (has IUD with her) Results reviewed.    Treatments in MAU included Toradol which provided some relief.   Pt stable at time of discharge.  Assessment: Abnormal uterine bleeding Dysmenorrhea Expelled IUD  Plan: Discharge home Recommend Call office to see Dr Hulan Fray to determine next steps Rx sent for Megace (high dose taper) for AUB  Encouraged to return here or to other Urgent Care/ED if she develops worsening of symptoms, increase in pain, fever, or other concerning symptoms.   Hansel Feinstein CNM, MSN Certified Nurse-Midwife 05/16/2018 10:33 PM

## 2018-05-16 NOTE — Discharge Instructions (Signed)
Abnormal Uterine Bleeding Abnormal uterine bleeding can affect women at various stages in life, including teenagers, women in their reproductive years, pregnant women, and women who have reached menopause. Several kinds of uterine bleeding are considered abnormal, including:  Bleeding or spotting between periods.  Bleeding after sexual intercourse.  Bleeding that is heavier or more than normal.  Periods that last longer than usual.  Bleeding after menopause.  Many cases of abnormal uterine bleeding are minor and simple to treat, while others are more serious. Any type of abnormal bleeding should be evaluated by your health care provider. Treatment will depend on the cause of the bleeding. Follow these instructions at home: Monitor your condition for any changes. The following actions may help to alleviate any discomfort you are experiencing:  Avoid the use of tampons and douches as directed by your health care provider.  Change your pads frequently.  You should get regular pelvic exams and Pap tests. Keep all follow-up appointments for diagnostic tests as directed by your health care provider. Contact a health care provider if:  Your bleeding lasts more than 1 week.  You feel dizzy at times. Get help right away if:  You pass out.  You are changing pads every 15 to 30 minutes.  You have abdominal pain.  You have a fever.  You become sweaty or weak.  You are passing large blood clots from the vagina.  You start to feel nauseous and vomit. This information is not intended to replace advice given to you by your health care provider. Make sure you discuss any questions you have with your health care provider. Document Released: 09/26/2005 Document Revised: 03/09/2016 Document Reviewed: 04/25/2013 Elsevier Interactive Patient Education  2017 [place]. Megestrol tablets What is this medicine? MEGESTROL (me JES trol) belongs to a class of drugs known as progestins.  Megestrol tablets are used to treat advanced breast or endometrial cancer. This medicine may be used for other purposes; ask your health care provider or pharmacist if you have questions. COMMON BRAND NAME(S): Megace What should I tell my health care provider before I take this medicine? They need to know if you have any of these conditions: -adrenal gland problems -history of blood clots of the legs, lungs, or other parts of the body -diabetes -kidney disease -liver disease -stroke -an unusual or allergic reaction to megestrol, other medicines, foods, dyes, or preservatives -pregnant or trying to get pregnant -breast-feeding How should I use this medicine? Take this medicine by mouth. Follow the directions on the prescription label. Do not take your medicine more often than directed. Take your doses at regular intervals. Do not stop taking except on the advice of your doctor or health care professional. Talk to your pediatrician regarding the use of this medicine in children. Special care may be needed. Overdosage: If you think you have taken too much of this medicine contact a poison control center or emergency room at once. NOTE: This medicine is only for you. Do not share this medicine with others. What if I miss a dose? If you miss a dose, take it as soon as you can. If it is almost time for your next dose, take only that dose. Do not take double or extra doses. What may interact with this medicine? Do not take this medicine with any of the following medications: -dofetilide This medicine may also interact with the following medications: -carbamazepine -indinavir -phenobarbital -phenytoin -primidone -rifampin -warfarin This list may not describe all possible interactions. Give your health  care provider a list of all the medicines, herbs, non-prescription drugs, or dietary supplements you use. Also tell them if you smoke, drink alcohol, or use illegal drugs. Some items may  interact with your medicine. What should I watch for while using this medicine? Visit your doctor or health care professional for regular checks on your progress. Continue taking this medicine even if you feel better. It may take 2 months of regular use before you know if this medicine is working for your condition. If you are a female of child-bearing age, use an effective method of birth control while you are taking this medicine. This medicine should not be used by females who are pregnant or breast-feeding. There is a potential for serious side effects to an unborn child or to an infant. Talk to your health care professional or pharmacist for more information. If you have diabetes, this medicine may affect blood sugar levels. Check your blood sugar and talk to your doctor or health care professional if you notice changes. What side effects may I notice from receiving this medicine? Side effects that you should report to your doctor or health care professional as soon as possible: -difficulty breathing or shortness of breath -chest pain -dizziness -fluid retention -increased blood pressure -leg pain or swelling -nausea and vomiting -skin rash or itching -weakness Side effects that usually do not require medical attention (report to your doctor or health care professional if they continue or are bothersome): -breakthrough menstrual bleeding -hot flashes or flushing -increased appetite -mood changes -sweating -weight gain This list may not describe all possible side effects. Call your doctor for medical advice about side effects. You may report side effects to FDA at 1-800-FDA-1088. Where should I keep my medicine? Keep out of the reach of children. Store at controlled room temperature between 15 and 30 degrees C (59 and 86 degrees F). Protect from heat above 40 degrees C (104 degrees F). Throw away any unused medicine after the expiration date. NOTE: This sheet is a summary. It may not  cover all possible information. If you have questions about this medicine, talk to your doctor, pharmacist, or health care provider.  2018 Elsevier/Gold Standard (2008-04-14 15:57:10)

## 2018-05-20 ENCOUNTER — Other Ambulatory Visit: Payer: Self-pay | Admitting: Family Medicine

## 2018-05-20 DIAGNOSIS — J301 Allergic rhinitis due to pollen: Secondary | ICD-10-CM

## 2018-05-21 ENCOUNTER — Other Ambulatory Visit: Payer: Self-pay | Admitting: *Deleted

## 2018-05-23 ENCOUNTER — Encounter (HOSPITAL_COMMUNITY): Payer: Self-pay

## 2018-05-23 ENCOUNTER — Ambulatory Visit (INDEPENDENT_AMBULATORY_CARE_PROVIDER_SITE_OTHER): Payer: 59 | Admitting: Obstetrics & Gynecology

## 2018-05-23 ENCOUNTER — Encounter: Payer: Self-pay | Admitting: Obstetrics & Gynecology

## 2018-05-23 VITALS — BP 135/77 | HR 90 | Wt 254.5 lb

## 2018-05-23 DIAGNOSIS — N939 Abnormal uterine and vaginal bleeding, unspecified: Secondary | ICD-10-CM

## 2018-05-23 DIAGNOSIS — Z30431 Encounter for routine checking of intrauterine contraceptive device: Secondary | ICD-10-CM | POA: Diagnosis not present

## 2018-05-23 MED ORDER — SUMATRIPTAN SUCCINATE 50 MG PO TABS: ORAL_TABLET | ORAL | 3 refills | Status: DC

## 2018-05-23 NOTE — Progress Notes (Signed)
   Subjective:    Patient ID: Erika Mckenzie, female    DOB: 1970/12/14, 47 y.o.   MRN: [id number]  HPI 47 yo separated P2 (46 and 34 yo daughters) here wanting a hysterectomy. She has been having irregular heavy bleeding for the last 7-8 months. She has an u/s at [facility] 1/19 that showed 2 2 cm fibroids. I did an EMBX recently that was normal. I placed an IUD for management of the bleeding, but it fell out with the heavy bleeding about 6 days later.    Review of Systems Pap was normal 2/17    Objective:   Physical Exam Breathing, conversing, and ambulating normally Well nourished, well hydrated Black female, no apparent distress Abd- benign Bimanual- normal size and shape, anteverted, mobile, non-tender, normal adnexal exam     Assessment & Plan:  DUB - I offered endometrial ablation/d&c but she is certain that she wishes to proceed with TVH/BS. We discussed the risks including but not limited to infection, bleeding, damage to pelvic organs, DVT, death.  I will send Jordan an email to schedule this.

## 2018-06-04 ENCOUNTER — Encounter (HOSPITAL_COMMUNITY): Payer: Self-pay

## 2018-06-04 NOTE — Patient Instructions (Addendum)
Your procedure is scheduled on: Tuesday, 9/10  Enter through the Main Entrance of [facility] at: 8 am  Pick up the phone at the desk and dial [phone].  Call this number if you have problems the morning of surgery: [phone].  Remember: Do NOT eat food or Do NOT drink clear liquids (including water) after midnight Monday.  Take these medicines the morning of surgery with a SIP OF WATER: Zyrtec and flonase if needed.  Ok to use eye drops if needed.  Brush your teeth the morning of surgery.  Do Not smoke on the day of surgery.  Stop herbal medications, vitamin supplements, Ibuprofen/NSAIDS at this time.  Do NOT wear jewelry (body piercing), metal hair clips/bobby pins, make-up, or nail polish. Do NOT wear lotions, powders, or perfumes.  You may wear deoderant. Do NOT shave for 48 hours prior to surgery. Do NOT bring valuables to the hospital. Contacts may not be worn into surgery.  Leave suitcase in car.  After surgery it may be brought to your room.  For patients admitted to the hospital, checkout time is 11:00 AM the day of discharge. Home with Sister Erika Mckenzie cell [phone] or Erika Mckenzie cell [phone].

## 2018-06-08 ENCOUNTER — Encounter (HOSPITAL_COMMUNITY)
Admission: RE | Admit: 2018-06-08 | Discharge: 2018-06-08 | Disposition: A | Discharge status: Home / Self Care | Payer: 59 | Source: Ambulatory Visit | Attending: Obstetrics & Gynecology | Admitting: Obstetrics & Gynecology

## 2018-06-08 ENCOUNTER — Encounter (HOSPITAL_COMMUNITY): Payer: Self-pay

## 2018-06-08 ENCOUNTER — Other Ambulatory Visit: Payer: Self-pay

## 2018-06-08 DIAGNOSIS — Z01812 Encounter for preprocedural laboratory examination: Secondary | ICD-10-CM | POA: Insufficient documentation

## 2018-06-08 HISTORY — DX: Gastro-esophageal reflux disease without esophagitis: K21.9

## 2018-06-08 HISTORY — DX: Anemia, unspecified: D64.9

## 2018-06-08 HISTORY — DX: Other seasonal allergic rhinitis: J30.2

## 2018-06-08 LAB — CBC
HEMATOCRIT: 33.9 % — AB (ref 36.0–46.0)
HEMOGLOBIN: 11.1 g/dL — AB (ref 12.0–15.0)
MCH: 25.5 pg — AB (ref 26.0–34.0)
MCHC: 32.7 g/dL (ref 30.0–36.0)
MCV: 77.9 fL — ABNORMAL LOW (ref 78.0–100.0)
Platelets: 205 10*3/uL (ref 150–400)
RBC: 4.35 MIL/uL (ref 3.87–5.11)
RDW: 17.9 % — AB (ref 11.5–15.5)
WBC: 6.7 10*3/uL (ref 4.0–10.5)

## 2018-06-08 LAB — TYPE AND SCREEN
ABO/RH(D): O POS
ANTIBODY SCREEN: NEGATIVE

## 2018-06-08 LAB — ABO/RH: ABO/RH(D): O POS

## 2018-06-19 ENCOUNTER — Observation Stay (HOSPITAL_COMMUNITY)
Admission: AD | Admit: 2018-06-19 | Discharge: 2018-06-20 | Disposition: A | Discharge status: Home / Self Care | Payer: 59 | Source: Ambulatory Visit | Attending: Obstetrics & Gynecology | Admitting: Obstetrics & Gynecology

## 2018-06-19 ENCOUNTER — Other Ambulatory Visit: Payer: Self-pay

## 2018-06-19 ENCOUNTER — Ambulatory Visit (HOSPITAL_COMMUNITY): Payer: 59 | Admitting: Anesthesiology

## 2018-06-19 ENCOUNTER — Encounter (HOSPITAL_COMMUNITY)
Admission: AD | Disposition: A | Discharge status: Home / Self Care | Payer: Self-pay | Source: Ambulatory Visit | Attending: Obstetrics & Gynecology

## 2018-06-19 ENCOUNTER — Encounter (HOSPITAL_COMMUNITY): Payer: Self-pay

## 2018-06-19 DIAGNOSIS — D259 Leiomyoma of uterus, unspecified: Secondary | ICD-10-CM | POA: Diagnosis not present

## 2018-06-19 DIAGNOSIS — G43909 Migraine, unspecified, not intractable, without status migrainosus: Secondary | ICD-10-CM | POA: Insufficient documentation

## 2018-06-19 DIAGNOSIS — Z23 Encounter for immunization: Secondary | ICD-10-CM | POA: Diagnosis not present

## 2018-06-19 DIAGNOSIS — D649 Anemia, unspecified: Secondary | ICD-10-CM | POA: Insufficient documentation

## 2018-06-19 DIAGNOSIS — F17210 Nicotine dependence, cigarettes, uncomplicated: Secondary | ICD-10-CM | POA: Insufficient documentation

## 2018-06-19 DIAGNOSIS — Z6841 Body Mass Index (BMI) 40.0 and over, adult: Secondary | ICD-10-CM | POA: Diagnosis not present

## 2018-06-19 DIAGNOSIS — Z79818 Long term (current) use of other agents affecting estrogen receptors and estrogen levels: Secondary | ICD-10-CM | POA: Insufficient documentation

## 2018-06-19 DIAGNOSIS — N938 Other specified abnormal uterine and vaginal bleeding: Secondary | ICD-10-CM | POA: Diagnosis not present

## 2018-06-19 DIAGNOSIS — N888 Other specified noninflammatory disorders of cervix uteri: Secondary | ICD-10-CM | POA: Diagnosis not present

## 2018-06-19 DIAGNOSIS — N800 Endometriosis of uterus: Secondary | ICD-10-CM | POA: Insufficient documentation

## 2018-06-19 DIAGNOSIS — Z9889 Other specified postprocedural states: Secondary | ICD-10-CM

## 2018-06-19 HISTORY — PX: VAGINAL HYSTERECTOMY: SHX2639

## 2018-06-19 LAB — PREGNANCY, URINE: PREG TEST UR: NEGATIVE

## 2018-06-19 SURGERY — HYSTERECTOMY, VAGINAL
Anesthesia: General | Site: Abdomen | Laterality: Bilateral

## 2018-06-19 MED ORDER — FENTANYL CITRATE (PF) 100 MCG/2ML IJ SOLN
50.0000 ug | INTRAMUSCULAR | Status: DC | PRN
  Administered 2018-06-19: 50 ug via INTRAVENOUS
  Filled 2018-06-19: qty 2

## 2018-06-19 MED ORDER — SODIUM CHLORIDE 0.9 % IJ SOLN
INTRAMUSCULAR | Status: DC | PRN
  Administered 2018-06-19: 100 mL

## 2018-06-19 MED ORDER — ONDANSETRON HCL 4 MG/2ML IJ SOLN
INTRAMUSCULAR | Status: AC
  Filled 2018-06-19: qty 2

## 2018-06-19 MED ORDER — PROMETHAZINE HCL 25 MG/ML IJ SOLN: 6.2500 mg | INTRAMUSCULAR | Status: DC | PRN

## 2018-06-19 MED ORDER — SUGAMMADEX SODIUM 200 MG/2ML IV SOLN
INTRAVENOUS | Status: AC
  Filled 2018-06-19: qty 2

## 2018-06-19 MED ORDER — BUPIVACAINE-EPINEPHRINE (PF) 0.5% -1:200000 IJ SOLN
INTRAMUSCULAR | Status: AC
  Filled 2018-06-19: qty 30

## 2018-06-19 MED ORDER — DEXAMETHASONE SODIUM PHOSPHATE 10 MG/ML IJ SOLN
INTRAMUSCULAR | Status: DC | PRN
  Administered 2018-06-19 (×2): 4 mg via INTRAVENOUS

## 2018-06-19 MED ORDER — GLYCOPYRROLATE 0.2 MG/ML IJ SOLN
INTRAMUSCULAR | Status: DC | PRN
  Administered 2018-06-19: 0.1 mg via INTRAVENOUS

## 2018-06-19 MED ORDER — MIDAZOLAM HCL 2 MG/2ML IJ SOLN
INTRAMUSCULAR | Status: AC
  Filled 2018-06-19: qty 2

## 2018-06-19 MED ORDER — MIDAZOLAM HCL 2 MG/2ML IJ SOLN
INTRAMUSCULAR | Status: DC | PRN
  Administered 2018-06-19: 2 mg via INTRAVENOUS

## 2018-06-19 MED ORDER — LIDOCAINE HCL (PF) 1 % IJ SOLN
INTRAMUSCULAR | Status: AC
  Filled 2018-06-19: qty 5

## 2018-06-19 MED ORDER — INFLUENZA VAC SPLIT QUAD 0.5 ML IM SUSY
0.5000 mL | PREFILLED_SYRINGE | INTRAMUSCULAR | Status: AC
  Administered 2018-06-20: 0.5 mL via INTRAMUSCULAR
  Filled 2018-06-19: qty 0.5

## 2018-06-19 MED ORDER — LACTATED RINGERS IV SOLN: INTRAVENOUS | Status: DC

## 2018-06-19 MED ORDER — PROPOFOL 10 MG/ML IV BOLUS
INTRAVENOUS | Status: AC
  Filled 2018-06-19: qty 20

## 2018-06-19 MED ORDER — KETOROLAC TROMETHAMINE 30 MG/ML IJ SOLN: 30.0000 mg | Freq: Once | INTRAMUSCULAR | Status: DC | PRN

## 2018-06-19 MED ORDER — ROCURONIUM BROMIDE 100 MG/10ML IV SOLN
INTRAVENOUS | Status: DC | PRN
  Administered 2018-06-19: 50 mg via INTRAVENOUS

## 2018-06-19 MED ORDER — PROPOFOL 10 MG/ML IV BOLUS
INTRAVENOUS | Status: DC | PRN
  Administered 2018-06-19: 200 mg via INTRAVENOUS

## 2018-06-19 MED ORDER — KETOROLAC TROMETHAMINE 30 MG/ML IJ SOLN
INTRAMUSCULAR | Status: AC
  Filled 2018-06-19: qty 1

## 2018-06-19 MED ORDER — ONDANSETRON HCL 4 MG PO TABS
4.0000 mg | ORAL_TABLET | Freq: Four times a day (QID) | ORAL | Status: DC | PRN
  Administered 2018-06-20: 4 mg via ORAL

## 2018-06-19 MED ORDER — SCOPOLAMINE 1 MG/3DAYS TD PT72
1.0000 | MEDICATED_PATCH | Freq: Once | TRANSDERMAL | Status: DC
  Administered 2018-06-19: 1.5 mg via TRANSDERMAL

## 2018-06-19 MED ORDER — MEPERIDINE HCL 25 MG/ML IJ SOLN: 6.2500 mg | INTRAMUSCULAR | Status: DC | PRN

## 2018-06-19 MED ORDER — SCOPOLAMINE 1 MG/3DAYS TD PT72
MEDICATED_PATCH | TRANSDERMAL | Status: AC
  Administered 2018-06-19: 1.5 mg via TRANSDERMAL
  Filled 2018-06-19: qty 1

## 2018-06-19 MED ORDER — LIDOCAINE HCL (CARDIAC) PF 100 MG/5ML IV SOSY
PREFILLED_SYRINGE | INTRAVENOUS | Status: DC | PRN
  Administered 2018-06-19: 50 mg via INTRAVENOUS

## 2018-06-19 MED ORDER — FENTANYL CITRATE (PF) 100 MCG/2ML IJ SOLN: 25.0000 ug | INTRAMUSCULAR | Status: DC | PRN

## 2018-06-19 MED ORDER — CEFAZOLIN SODIUM-DEXTROSE 2-4 GM/100ML-% IV SOLN
2.0000 g | INTRAVENOUS | Status: AC
  Administered 2018-06-19: 2 g via INTRAVENOUS

## 2018-06-19 MED ORDER — ACETAMINOPHEN 500 MG PO TABS
1000.0000 mg | ORAL_TABLET | Freq: Four times a day (QID) | ORAL | Status: DC | PRN
  Administered 2018-06-19: 1000 mg via ORAL
  Filled 2018-06-19: qty 2

## 2018-06-19 MED ORDER — SODIUM CHLORIDE 0.9 % IJ SOLN
INTRAMUSCULAR | Status: AC
  Filled 2018-06-19: qty 100

## 2018-06-19 MED ORDER — DEXAMETHASONE SODIUM PHOSPHATE 4 MG/ML IJ SOLN
INTRAMUSCULAR | Status: AC
  Filled 2018-06-19: qty 1

## 2018-06-19 MED ORDER — FENTANYL CITRATE (PF) 100 MCG/2ML IJ SOLN
INTRAMUSCULAR | Status: DC | PRN
  Administered 2018-06-19 (×3): 50 ug via INTRAVENOUS
  Administered 2018-06-19: 100 ug via INTRAVENOUS

## 2018-06-19 MED ORDER — LACTATED RINGERS IV SOLN
INTRAVENOUS | Status: DC
  Administered 2018-06-19: 10:00:00 via INTRAVENOUS
  Administered 2018-06-19: 125 mL/h via INTRAVENOUS

## 2018-06-19 MED ORDER — SUMATRIPTAN SUCCINATE 50 MG PO TABS
100.0000 mg | ORAL_TABLET | Freq: Once | ORAL | Status: AC
  Administered 2018-06-19: 100 mg via ORAL
  Filled 2018-06-19 (×2): qty 1

## 2018-06-19 MED ORDER — BUPIVACAINE-EPINEPHRINE 0.5% -1:200000 IJ SOLN
INTRAMUSCULAR | Status: DC | PRN
  Administered 2018-06-19: 30 mL

## 2018-06-19 MED ORDER — KETOROLAC TROMETHAMINE 30 MG/ML IJ SOLN
INTRAMUSCULAR | Status: DC | PRN
  Administered 2018-06-19: 30 mg via INTRAVENOUS

## 2018-06-19 MED ORDER — IBUPROFEN 800 MG PO TABS
800.0000 mg | ORAL_TABLET | Freq: Three times a day (TID) | ORAL | Status: DC | PRN
  Administered 2018-06-20: 800 mg via ORAL
  Filled 2018-06-19 (×2): qty 1

## 2018-06-19 MED ORDER — AZITHROMYCIN 1 G PO PACK: 1.0000 g | PACK | Freq: Once | ORAL | Status: DC

## 2018-06-19 MED ORDER — ONDANSETRON HCL 4 MG/2ML IJ SOLN
INTRAMUSCULAR | Status: DC | PRN
  Administered 2018-06-19: 4 mg via INTRAVENOUS

## 2018-06-19 MED ORDER — FENTANYL CITRATE (PF) 250 MCG/5ML IJ SOLN
INTRAMUSCULAR | Status: AC
  Filled 2018-06-19: qty 5

## 2018-06-19 MED ORDER — SUGAMMADEX SODIUM 200 MG/2ML IV SOLN
INTRAVENOUS | Status: DC | PRN
  Administered 2018-06-19: 200 mg via INTRAVENOUS

## 2018-06-19 MED ORDER — CEFTRIAXONE SODIUM 250 MG IJ SOLR: 250.0000 mg | INTRAMUSCULAR | Status: DC

## 2018-06-19 MED ORDER — ONDANSETRON HCL 4 MG/2ML IJ SOLN
4.0000 mg | Freq: Four times a day (QID) | INTRAMUSCULAR | Status: DC | PRN
  Administered 2018-06-19: 4 mg via INTRAVENOUS
  Filled 2018-06-19: qty 2

## 2018-06-19 SURGICAL SUPPLY — 26 items
BLADE SURG 10 STRL SS (BLADE) ×4 IMPLANT
CANISTER SUCT 3000ML PPV (MISCELLANEOUS) ×2 IMPLANT
CONT PATH 16OZ SNAP LID 3702 (MISCELLANEOUS) ×2 IMPLANT
DECANTER SPIKE VIAL GLASS SM (MISCELLANEOUS) ×3 IMPLANT
GLOVE BIO SURGEON STRL SZ 6.5 (GLOVE) ×2 IMPLANT
GLOVE BIOGEL PI IND STRL 6.5 (GLOVE) ×1 IMPLANT
GLOVE BIOGEL PI IND STRL 7.0 (GLOVE) ×1 IMPLANT
GLOVE BIOGEL PI INDICATOR 6.5 (GLOVE) ×1
GLOVE BIOGEL PI INDICATOR 7.0 (GLOVE) ×1
GOWN STRL REUS W/TWL LRG LVL3 (GOWN DISPOSABLE) ×8 IMPLANT
NDL MAYO CATGUT SZ4 TPR NDL (NEEDLE) ×1 IMPLANT
NDL SPNL 18GX3.5 QUINCKE PK (NEEDLE) ×1 IMPLANT
NEEDLE MAYO CATGUT SZ4 (NEEDLE) ×2 IMPLANT
NEEDLE SPNL 18GX3.5 QUINCKE PK (NEEDLE) ×2 IMPLANT
NS IRRIG 1000ML POUR BTL (IV SOLUTION) ×2 IMPLANT
PACK VAGINAL WOMENS (CUSTOM PROCEDURE TRAY) ×2 IMPLANT
PAD OB MATERNITY 4.3X12.25 (PERSONAL CARE ITEMS) ×2 IMPLANT
SUT VIC AB 0 CT1 27 (SUTURE) ×2
SUT VIC AB 0 CT1 27XBRD ANBCTR (SUTURE) IMPLANT
SUT VIC AB 0 CT1 36 (SUTURE) IMPLANT
SUT VIC AB 2-0 CT1 18 (SUTURE) ×4 IMPLANT
SUT VIC AB 2-0 CT1 27 (SUTURE) ×4
SUT VIC AB 2-0 CT1 TAPERPNT 27 (SUTURE) ×2 IMPLANT
SUT VICRYL 0 TIES 12 18 (SUTURE) IMPLANT
TOWEL OR 17X24 6PK STRL BLUE (TOWEL DISPOSABLE) ×4 IMPLANT
TRAY FOLEY W/BAG SLVR 14FR (SET/KITS/TRAYS/PACK) ×2 IMPLANT

## 2018-06-19 NOTE — Anesthesia Postprocedure Evaluation (Signed)
Anesthesia Post Note  Patient: Erika Mckenzie  Procedure(s) Performed: HYSTERECTOMY VAGINAL WITH  Right SALPINGECTOMY (Bilateral Abdomen)     Patient location during evaluation: PACU Anesthesia Type: General Level of consciousness: awake and sedated Pain management: pain level controlled Vital Signs Assessment: post-procedure vital signs reviewed and stable Respiratory status: spontaneous breathing Cardiovascular status: stable Postop Assessment: no apparent nausea or vomiting Anesthetic complications: no    Last Vitals:  Vitals:   06/19/18 1225 06/19/18 1329  BP: 123/72 117/78  Pulse: (!) 57 (!) 55  Resp: 16 16  Temp: 36.9 C 36.9 C  SpO2: 97% 100%    Last Pain:  Vitals:   06/19/18 1329  TempSrc: Axillary  PainSc:    Pain Goal: Patients Stated Pain Goal: 3 (06/19/18 1200)               Mckenzy Salazar JR,JOHN Tyreanna Bisesi

## 2018-06-19 NOTE — Op Note (Signed)
06/19/2018  10:32 AM  PATIENT:  Erika Mckenzie  47 y.o. female  PRE-OPERATIVE DIAGNOSIS:  dub  POST-OPERATIVE DIAGNOSIS:  dub  PROCEDURE:  Procedure(s): HYSTERECTOMY VAGINAL WITH  Bilateral SALPINGECTOMY (Bilateral)  SURGEON:  Surgeon(s) and Role:    * Hulan Fray, Wilhemina Cash, MD - Primary    * Chancy Milroy, MD - Assisting   ASSISTANTS: Ethelene Browns, MS3   ANESTHESIA:   local and general  EBL:  300 mL   BLOOD ADMINISTERED:none  DRAINS: none   LOCAL MEDICATIONS USED:  MARCAINE     SPECIMEN:  Source of Specimen:  uterus and right oviduct  DISPOSITION OF SPECIMEN:  PATHOLOGY  COUNTS:  YES  TOURNIQUET:  * No tourniquets in log *  DICTATION: .Dragon Dictation  PLAN OF CARE: Admit for overnight observation  PATIENT DISPOSITION:  PACU- good condition   Delay start of Pharmacological VTE agent (>24hrs) due to surgical blood loss or risk of bleeding: not applicable  The risks, benefits, alternatives of surgery were explained, understood, and accepted. All questions were answered and a consent form was signed. She was taken to the operating room and general anesthesia was applied. She was put in the dorsal lithotomy position. Her vagina was prepped and draped in the usual sterile fashion. A timeout procedure was done.  A Foley catheter was placed and it drained clear urine throughout the case. The cervix was grasped with a single-tooth tenaculum. A total of 90 cc of dilute Marcaine was injected in a circumferential fashion at the cervicovaginal junction. An incision was made at the site. The posterior peritoneum was entered. A long weighted speculum was placed. The anterior peritoneum was entered. A Deaver was placed anteriorly. The uterosacral ligaments were clamped, cut, and ligated. They were tagged and held. 2 Vicryl sutures used throughout this case unless otherwise specified. The large uterus was separated from its pelvic attachments using a similar clamp, cut, ligate technique. I  had to morcelate the uterus to be able to visualize the pedicles. Once the uterus was removed, the bowel was kept out of the operative site with a sponge on a stick. I was able to visualize each adnexa and grabbed the right oviduct  with a Babcock clamp. Using a Kelly clamp, I was able to clamp, cut, and ligate the fimbriated end of the   Oviduct. I was unable to see or feel the left ovary or oviduct.  I then assured hemostasis of all pedicles.  I used the tagged and held sutures of the uterosacral ligaments to incorporate them into the vaginal cuff angles. I then closed the vaginal cuff in a verticle, running, locking fashion. She was extubated and taken to the recovery room in stable condition.

## 2018-06-19 NOTE — Anesthesia Preprocedure Evaluation (Signed)
Anesthesia Evaluation  Patient identified by MRN, date of birth, ID band Patient awake    Reviewed: Allergy & Precautions, NPO status , Patient's Chart, lab work & pertinent test results  Airway Mallampati: II       Dental no notable dental hx. (+) Teeth Intact   Pulmonary Current Smoker,    Pulmonary exam normal breath sounds clear to auscultation       Cardiovascular Normal cardiovascular exam Rhythm:Regular Rate:Normal     Neuro/Psych    GI/Hepatic Neg liver ROS,   Endo/Other  Morbid obesity  Renal/GU negative Renal ROS  negative genitourinary   Musculoskeletal negative musculoskeletal ROS (+)   Abdominal (+) + obese,   Peds  Hematology  (+) anemia ,   Anesthesia Other Findings   Reproductive/Obstetrics                             Anesthesia Physical Anesthesia Plan  ASA: III  Anesthesia Plan: General   Post-op Pain Management:    Induction: Intravenous  PONV Risk Score and Plan: 4 or greater and Ondansetron, Dexamethasone, Scopolamine patch - Pre-op and Midazolam  Airway Management Planned: LMA  Additional Equipment:   Intra-op Plan:   Post-operative Plan: Extubation in OR  Informed Consent: I have reviewed the patients History and Physical, chart, labs and discussed the procedure including the risks, benefits and alternatives for the proposed anesthesia with the patient or authorized representative who has indicated his/her understanding and acceptance.   Dental advisory given  Plan Discussed with: CRNA and Surgeon  Anesthesia Plan Comments: (Proseal LMA)        Anesthesia Quick Evaluation

## 2018-06-19 NOTE — Transfer of Care (Signed)
Immediate Anesthesia Transfer of Care Note  Patient: Erika Mckenzie  Procedure(s) Performed: HYSTERECTOMY VAGINAL WITH  Bilateral SALPINGECTOMY (Bilateral Abdomen)  Patient Location: PACU  Anesthesia Type:General  Level of Consciousness: awake, alert  and oriented  Airway & Oxygen Therapy: Patient Spontanous Breathing and Patient connected to nasal cannula oxygen  Post-op Assessment: Report given to RN and Post -op Vital signs reviewed and stable  Post vital signs: Reviewed and stable  Last Vitals:  Vitals Value Taken Time  BP    Temp    Pulse 69 06/19/2018 10:54 AM  Resp 19 06/19/2018 10:54 AM  SpO2 98 % 06/19/2018 10:54 AM  Vitals shown include unvalidated device data.  Last Pain:  Vitals:   06/19/18 0806  TempSrc: Oral      Patients Stated Pain Goal: 3 (00/97/94 9971)  Complications: No apparent anesthesia complications

## 2018-06-19 NOTE — Anesthesia Procedure Notes (Signed)
Procedure Name: Intubation Date/Time: 06/19/2018 9:33 AM Performed by: Bufford Spikes, CRNA Pre-anesthesia Checklist: Patient identified, Emergency Drugs available, Suction available and Patient being monitored Patient Re-evaluated:Patient Re-evaluated prior to induction Oxygen Delivery Method: Circle system utilized Preoxygenation: Pre-oxygenation with 100% oxygen Induction Type: IV induction Ventilation: Mask ventilation without difficulty Laryngoscope Size: Miller and 2 Grade View: Grade I Tube type: Oral Tube size: 7.0 mm Number of attempts: 1 Airway Equipment and Method: Stylet and Oral airway Placement Confirmation: ETT inserted through vocal cords under direct vision,  positive ETCO2 and breath sounds checked- equal and bilateral Secured at: 21 cm Tube secured with: Tape Dental Injury: Teeth and Oropharynx as per pre-operative assessment

## 2018-06-19 NOTE — H&P (Signed)
Erika Mckenzie is an 47 yo separated P2 (9 and 16 yo daughters) here for a hysterectomy. She has been having irregular heavy bleeding for the last 7-8 months. She has an u/s at [facility] 1/19 that showed 2 2 cm fibroids. I did an EMBX recently that was normal. I placed an IUD for management of the bleeding, but it fell out with the heavy bleeding about 6 days later. She also tried OCPs which did not help. In a month, she basically bleeds every day.     Menstrual History: Menarche age: 36 No LMP recorded.    Past Medical History:  Diagnosis Date  . Anemia   . GERD (gastroesophageal reflux disease)    otc med prn  . Migraines   . Seasonal allergies   . SVD (spontaneous vaginal delivery)    x 2    Past Surgical History:  Procedure Laterality Date  . CHOLECYSTECTOMY    . DILATION AND CURETTAGE OF UTERUS     MAB  . TONSILLECTOMY    . TUBAL LIGATION    . WISDOM TOOTH EXTRACTION      History reviewed. No pertinent family history.  Social History:  reports that she has been smoking cigarettes. She has a 7.00 pack-year smoking history. She has never used smokeless tobacco. She reports that she drinks about 3.0 standard drinks of alcohol per week. She reports that she does not use drugs.  Allergies:  Allergies  Allergen Reactions  . Tramadol Hives    Medications Prior to Admission  Medication Sig Dispense Refill Last Dose  . Ca Carbonate-Mag Hydroxide (ROLAIDS PO) Take 3 tablets by mouth as needed.   Past Week at Unknown time  . cetirizine (ZYRTEC) 10 MG tablet TAKE 1 TABLET BY MOUTH DAILY (Patient taking differently: Take 10 mg by mouth daily as needed for allergies. ) 30 tablet 0 Past Week at Unknown time  . ferrous sulfate 325 (65 FE) MG tablet Take 1 tablet (325 mg total) by mouth daily. 30 tablet 2 Past Month at Unknown time  . fluorometholone (FML) 0.1 % ophthalmic suspension Place 1 drop into both eyes daily as needed for irritation.  0 06/18/2018 at Unknown time  .  LASTACAFT 0.25 % SOLN Place 1 drop into both eyes daily as needed for irritation.  2 06/18/2018 at Unknown time  . megestrol (MEGACE) 40 MG tablet Take two tablets three times daily for 3 days, then two tablets twice daily for 3 days, then one tablet twice daily (Patient taking differently: Take 40 mg by mouth daily. Take two tablets three times daily for 3 days, then two tablets twice daily for 3 days, then one tablet twice daily) 90 tablet 3 06/18/2018 at Unknown time  . SUMAtriptan (IMITREX) 50 MG tablet TAKE 1 TABLET BY MOUTH EVERY 2 HOURS AS NEEDED FOR MIGRAINE 30 tablet 3 Past Month at Unknown time  . fluticasone (FLONASE) 50 MCG/ACT nasal spray Place 2 sprays into both nostrils daily. (Patient taking differently: Place 2 sprays into both nostrils daily as needed for allergies. ) 16 g 6 More than a month at Unknown time  . ibuprofen (ADVIL,MOTRIN) 800 MG tablet Take 1 tablet (800 mg total) by mouth every 8 (eight) hours as needed. (Patient not taking: Reported on 06/01/2018) 30 tablet 0 Not Taking at Unknown time    ROS  Works for [employer] She had a BTL Monogamous for more than a year, denies dyspaeunia.  Blood pressure 111/65, pulse 78, temperature 98.1 F (  36.7 C), temperature source Oral, resp. rate 16, SpO2 100 %. Physical Exam  Breathing, conversing, and ambulating normally Well nourished, well hydrated Black female, no apparent distress Abd- benign, obese  Results for orders placed or performed during the hospital encounter of 06/19/18 (from the past 24 hour(s))  Pregnancy, urine     Status: None   Collection Time: 06/19/18  7:40 AM  Result Value Ref Range   Preg Test, Ur NEGATIVE NEGATIVE    No results found.  Assessment/Plan: DUB- failed medical therapy She declines endometrial ablation.  Plan for TVH/BS  She understands the risks of surgery, including, but not to infection, bleeding, DVTs, damage to bowel, bladder, ureters. She wishes to proceed.     Emily Filbert 06/19/2018, 8:48 AM

## 2018-06-19 NOTE — Discharge Instructions (Signed)
Vaginal Hysterectomy, Care After °Refer to this sheet in the next few weeks. These instructions provide you with information about caring for yourself after your procedure. Your health care provider may also give you more specific instructions. Your treatment has been planned according to current medical practices, but problems sometimes occur. Call your health care provider if you have any problems or questions after your procedure. °What can I expect after the procedure? °After the procedure, it is common to have: °· Pain. °· Soreness and numbness in your incision areas. °· Vaginal bleeding and discharge. °· Constipation. °· Temporary problems emptying the bladder. °· Feelings of sadness or other emotions. ° °Follow these instructions at home: °Medicines °· Take over-the-counter and prescription medicines only as told by your health care provider. °· If you were prescribed an antibiotic medicine, take it as told by your health care provider. Do not stop taking the antibiotic even if you start to feel better. °· Do not drive or operate heavy machinery while taking prescription pain medicine. °Activity °· Return to your normal activities as told by your health care provider. Ask your health care provider what activities are safe for you. °· Get regular exercise as told by your health care provider. You may be told to take short walks every day and go farther each time. °· Do not lift anything that is heavier than 10 lb (4.5 kg). °General instructions ° °· Do not put anything in your vagina for 6 weeks after your surgery or as told by your health care provider. This includes tampons and douches. °· Do not have sex until your health care provider says you can. °· Do not take baths, swim, or use a hot tub until your health care provider approves. °· Drink enough fluid to keep your urine clear or pale yellow. °· Do not drive for 24 hours if you were given a sedative. °· Keep all follow-up visits as told by your health  care provider. This is important. °Contact a health care provider if: °· Your pain medicine is not helping. °· You have a fever. °· You have redness, swelling, or pain at your incision site. °· You have blood, pus, or a bad-smelling discharge from your vagina. °· You continue to have difficulty urinating. °Get help right away if: °· You have severe abdominal or back pain. °· You have heavy bleeding from your vagina. °· You have chest pain or shortness of breath. °This information is not intended to replace advice given to you by your health care provider. Make sure you discuss any questions you have with your health care provider. °Document Released: 01/18/2016 Document Revised: 03/03/2016 Document Reviewed: 10/11/2015 °Elsevier Interactive Patient Education © 2018 Elsevier Inc. ° °

## 2018-06-19 NOTE — Plan of Care (Signed)
Pts. Condition will continue to improve 

## 2018-06-20 ENCOUNTER — Encounter (HOSPITAL_COMMUNITY): Payer: Self-pay | Admitting: Obstetrics & Gynecology

## 2018-06-20 DIAGNOSIS — N938 Other specified abnormal uterine and vaginal bleeding: Secondary | ICD-10-CM | POA: Diagnosis not present

## 2018-06-20 LAB — CBC
HCT: 28.6 % — ABNORMAL LOW (ref 36.0–46.0)
HEMOGLOBIN: 9.4 g/dL — AB (ref 12.0–15.0)
MCH: 25.5 pg — ABNORMAL LOW (ref 26.0–34.0)
MCHC: 32.9 g/dL (ref 30.0–36.0)
MCV: 77.7 fL — ABNORMAL LOW (ref 78.0–100.0)
PLATELETS: 176 10*3/uL (ref 150–400)
RBC: 3.68 MIL/uL — AB (ref 3.87–5.11)
RDW: 17 % — ABNORMAL HIGH (ref 11.5–15.5)
WBC: 9.2 10*3/uL (ref 4.0–10.5)

## 2018-06-20 MED ORDER — ACETAMINOPHEN ER 650 MG PO TBCR: 650.0000 mg | EXTENDED_RELEASE_TABLET | Freq: Three times a day (TID) | ORAL | 1 refills | Status: DC | PRN

## 2018-06-20 MED ORDER — IBUPROFEN 800 MG PO TABS: 800.0000 mg | ORAL_TABLET | Freq: Three times a day (TID) | ORAL | 1 refills | Status: DC | PRN

## 2018-06-20 NOTE — Discharge Summary (Signed)
Physician Discharge Summary  Patient ID: Erika Mckenzie MRN: [id number] DOB/AGE: [birth year] 47 y.o.  Admit date: 06/19/2018 Discharge date: 06/20/2018  Admission Diagnoses: DUB, morbid obesity  Discharge Diagnoses: same Active Problems:   Post-operative state   Discharged Condition: good  Hospital Course: She underwent an uncomplicated TVH/Rigth salpingectomy. By POD #1 she voiced her readiness/impatience to go home. She was tolerating po well. She is having flatus. She is voiding and ambulating.  Consults: None  Significant Diagnostic Studies: labs: Post op hbg 9.4  Treatments: surgery: as above  Discharge Exam: Blood pressure 98/75, pulse 67, temperature 98.8 F (37.1 C), temperature source Oral, resp. rate 18, SpO2 100 %. General appearance: alert Resp: clear to auscultation bilaterally Cardio: regular rate and rhythm, S1, S2 normal, no murmur, click, rub or gallop GI: soft, non-tender; bowel sounds normal; no masses,  no organomegaly  Disposition: Discharge disposition: 01-Home or Self Care        Allergies as of 06/20/2018      Reactions   Tramadol Hives      Medication List    STOP taking these medications   ferrous sulfate 325 (65 FE) MG tablet     TAKE these medications   acetaminophen 650 MG CR tablet Commonly known as:  TYLENOL Take 1 tablet (650 mg total) by mouth every 8 (eight) hours as needed for pain.   cetirizine 10 MG tablet Commonly known as:  ZYRTEC TAKE 1 TABLET BY MOUTH DAILY What changed:    when to take this  reasons to take this   fluorometholone 0.1 % ophthalmic suspension Commonly known as:  FML Place 1 drop into both eyes daily as needed for irritation.   fluticasone 50 MCG/ACT nasal spray Commonly known as:  FLONASE Place 2 sprays into both nostrils daily. What changed:    when to take this  reasons to take this   ibuprofen 800 MG tablet Commonly known as:  ADVIL,MOTRIN Take 1 tablet (800 mg total) by mouth  every 8 (eight) hours as needed (mild pain).   LASTACAFT 0.25 % Soln Generic drug:  Alcaftadine Place 1 drop into both eyes daily as needed for irritation.   ROLAIDS PO Take 3 tablets by mouth as needed.   SUMAtriptan 50 MG tablet Commonly known as:  IMITREX TAKE 1 TABLET BY MOUTH EVERY 2 HOURS AS NEEDED FOR MIGRAINE      Follow-up Information    Emily Filbert, MD. Schedule an appointment as soon as possible for a visit in 4 week(s).   Specialty:  Obstetrics and Gynecology Contact information: [place] [address] [phone]           Signed: Emily Filbert 06/20/2018, 12:04 PM

## 2018-07-20 ENCOUNTER — Encounter: Payer: Self-pay | Admitting: Obstetrics & Gynecology

## 2018-07-20 ENCOUNTER — Other Ambulatory Visit: Payer: Self-pay | Admitting: Family Medicine

## 2018-07-20 ENCOUNTER — Ambulatory Visit (INDEPENDENT_AMBULATORY_CARE_PROVIDER_SITE_OTHER): Payer: 59 | Admitting: Obstetrics & Gynecology

## 2018-07-20 VITALS — BP 128/78 | HR 101 | Wt 255.6 lb

## 2018-07-20 DIAGNOSIS — J301 Allergic rhinitis due to pollen: Secondary | ICD-10-CM

## 2018-07-20 DIAGNOSIS — Z9889 Other specified postprocedural states: Secondary | ICD-10-CM

## 2018-07-20 MED ORDER — METRONIDAZOLE 500 MG PO TABS: 500.0000 mg | ORAL_TABLET | Freq: Two times a day (BID) | ORAL | 0 refills | Status: DC

## 2018-07-20 NOTE — Progress Notes (Signed)
   Subjective:    Patient ID: Erika Mckenzie, female    DOB: 1970/10/18, 47 y.o.   MRN: [id number]  HPI 47 yo separated P2 here for a post op visit. She had a TVH/BS on 06/19/18 for DUB and fibroids. She has not had sex, no problems. She feels ready to go back to work Monday with light duty for a week. She is a Nurse, children's.   Review of Systems Pathology showed fibroids, benign    Objective:   Physical Exam Breathing, conversing, and ambulating normally Well nourished, well hydrated Black female, no apparent distress Abd- benign, obese Vaginal cuff- healing well, vaginal discharge c/w BV Bimanual exam normal     Assessment & Plan:  Postop- doing well BV- treat with flagyl (no alcohol) Come back 1 year/prn sooner Rec healthy lifestyle choices

## 2018-08-07 NOTE — Progress Notes (Deleted)
  Subjective:   Patient ID: Normand Sloop    DOB: [birth year], 47 y.o. female   MRN: [id number]  SOSIE GATO is a 47 y.o. female with a history of migraine, AR, GERD, obesity, tobacco use, prediabetes here for   Sinus trouble - zyrtec, flonase***  Review of Systems:  Per HPI.  [place], medications and smoking status reviewed.  Objective:   LMP  (LMP Unknown) Comment: Cont Bleeding since 02/2018 Vitals and nursing note reviewed.  General: well nourished, well developed, in no acute distress with non-toxic appearance HEENT: normocephalic, atraumatic, moist mucous membranes Neck: supple, non-tender without lymphadenopathy CV: regular rate and rhythm without murmurs, rubs, or gallops, no lower extremity edema Lungs: clear to auscultation bilaterally with normal work of breathing Abdomen: soft, non-tender, non-distended, no masses or organomegaly palpable, normoactive bowel sounds Skin: warm, dry, no rashes or lesions Extremities: warm and well perfused, normal tone MSK: ROM grossly intact, strength intact, gait normal Neuro: Alert and oriented, speech normal  Assessment & Plan:   No problem-specific Assessment & Plan notes found for this encounter.  No orders of the defined types were placed in this encounter.  No orders of the defined types were placed in this encounter.   Rory Percy, DO PGY-2, [facility] 08/07/2018 9:12 PM

## 2018-08-08 ENCOUNTER — Ambulatory Visit: Payer: 59

## 2018-10-14 IMAGING — CR DG KNEE AP/LAT W/ SUNRISE*L*
3 series · 3 of 3 positions shown · non-contrast
Comparison: None.

CLINICAL DATA: Worsening chronic bilateral knee pain

EXAM:
RIGHT KNEE 3 VIEWS; LEFT KNEE 3 VIEWS

[w knee ap left]
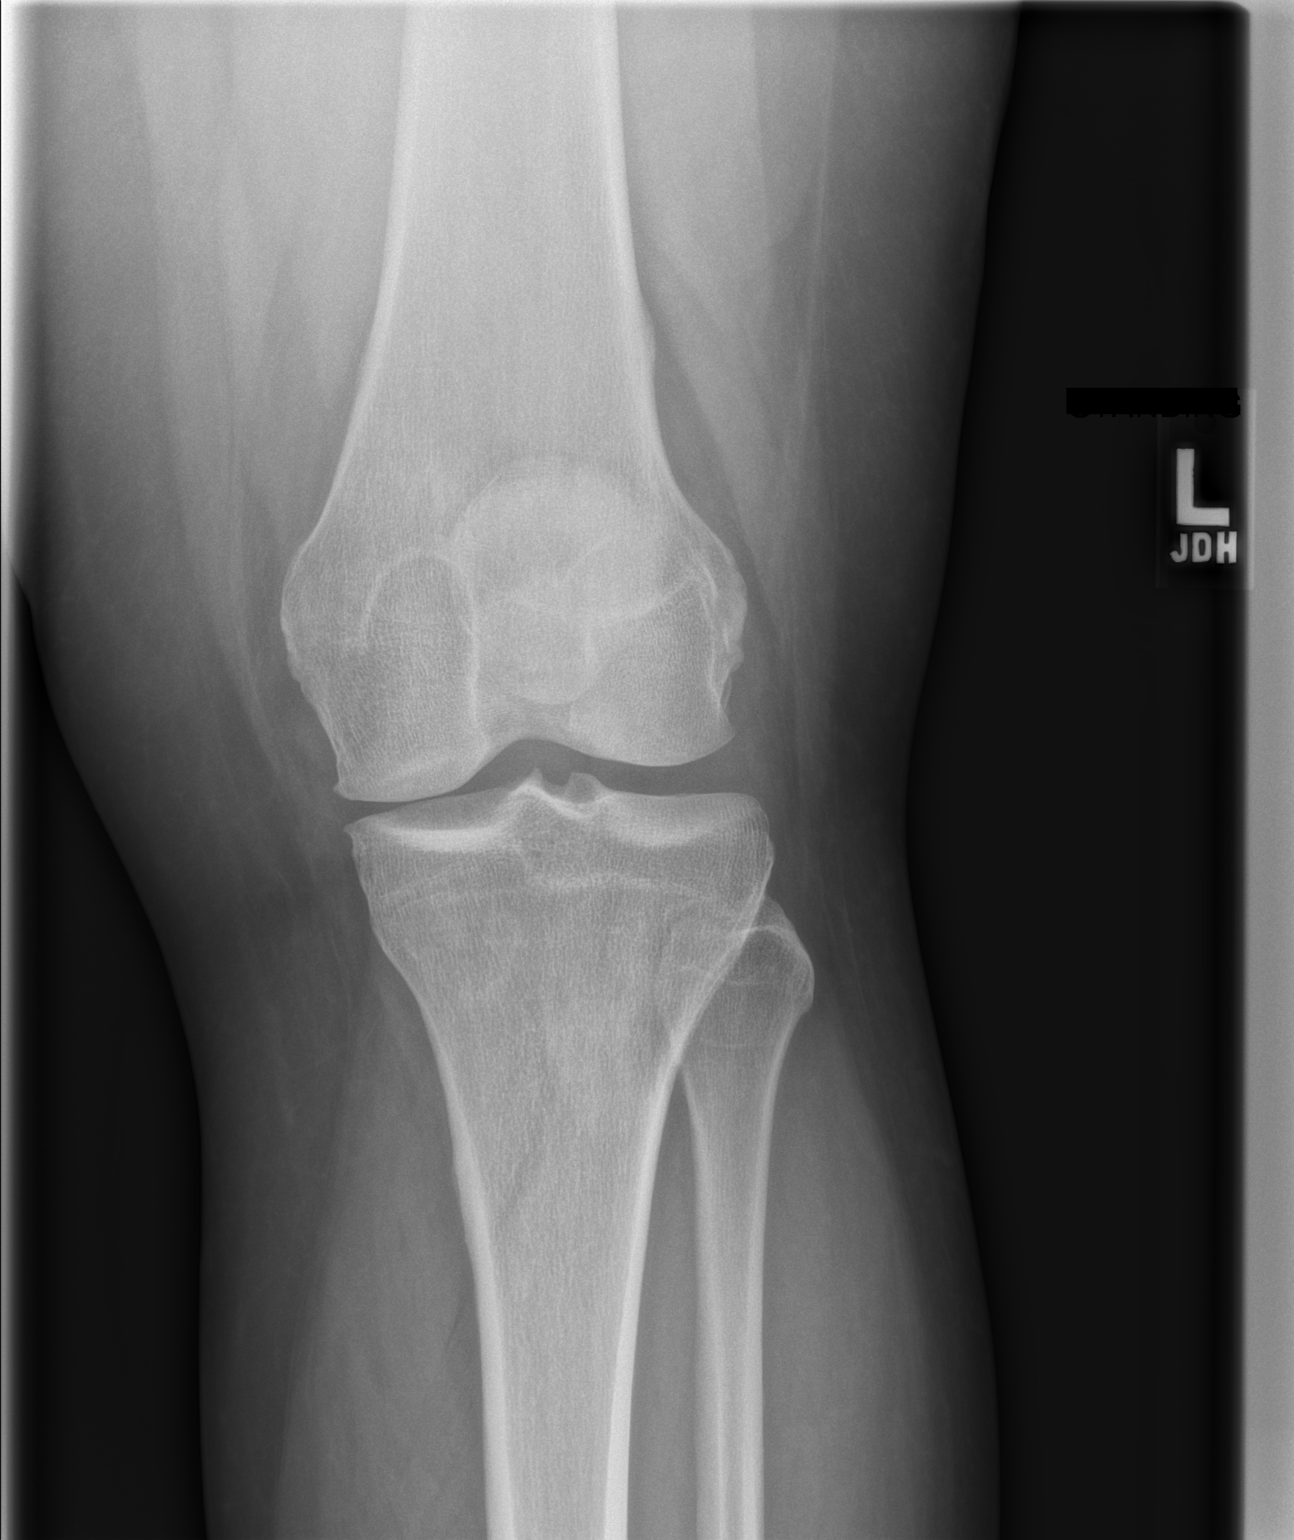

[w knee lat left]
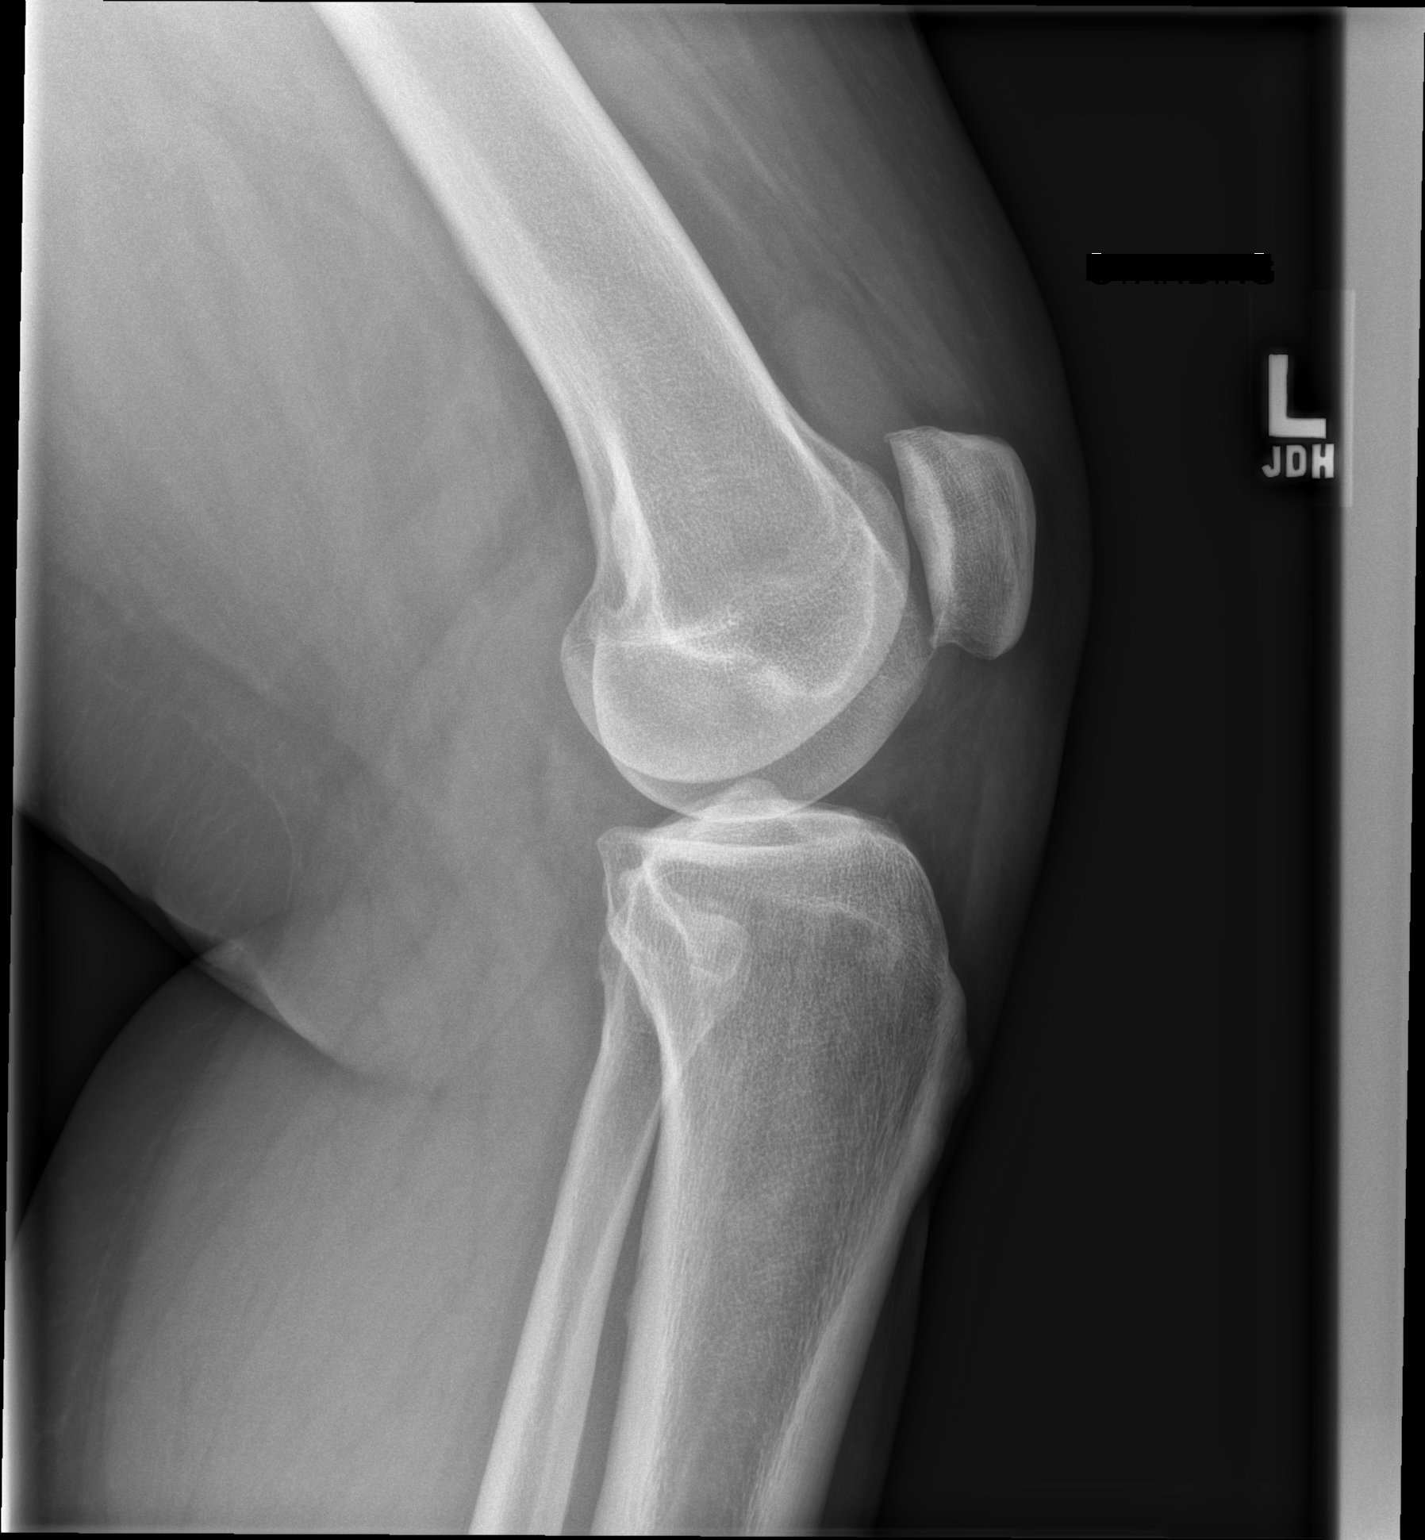

[x knee sunrise left]
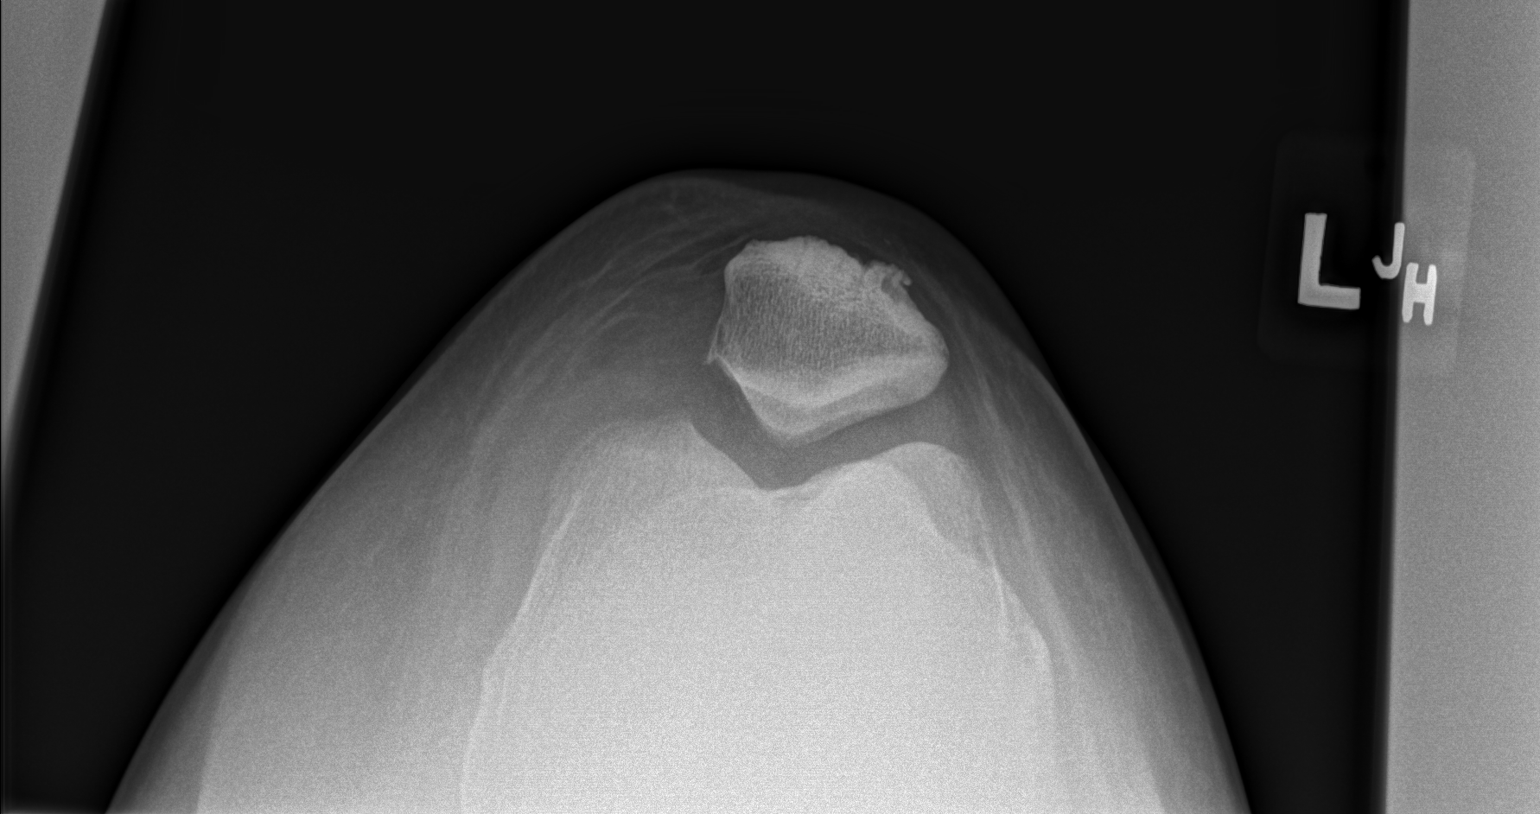

[3 of 3 positions shown; findings below may reference images not displayed]

FINDINGS: Bones:  No fracture or dislocation.  Normal bone mineralization.

Joints: Normal alignment. No erosive changes. Minimal left medial
femorotibial compartment joint space narrowing with tiny marginal
osteophytes. Left lateral femorotibial compartment and
patellofemoral compartment joint spaces are relatively well
maintained. Small left patellofemoral compartment marginal
osteophytes. Small tricompartmental right knee marginal osteophytes
without significant joint space narrowing. Small left knee joint
effusion. No left knee joint effusion.

Soft tissue: No soft tissue abnormality. No radiopaque foreign body.
No subcutaneous emphysema.
IMPRESSION: 1. Minimal left medial femorotibial compartment osteoarthritis.
2. Tiny tricompartmental right knee marginal osteophytes without
significant joint space narrowing.

## 2018-10-26 ENCOUNTER — Ambulatory Visit: Payer: 59 | Admitting: Family Medicine

## 2019-03-13 ENCOUNTER — Encounter: Payer: Self-pay | Admitting: *Deleted

## 2019-04-25 ENCOUNTER — Ambulatory Visit (INDEPENDENT_AMBULATORY_CARE_PROVIDER_SITE_OTHER): Payer: 59

## 2019-04-25 ENCOUNTER — Other Ambulatory Visit: Payer: Self-pay | Admitting: Podiatry

## 2019-04-25 ENCOUNTER — Other Ambulatory Visit: Payer: Self-pay

## 2019-04-25 ENCOUNTER — Encounter: Payer: Self-pay | Admitting: Podiatry

## 2019-04-25 ENCOUNTER — Ambulatory Visit: Payer: 59 | Admitting: Podiatry

## 2019-04-25 VITALS — BP 128/83 | HR 71 | Temp 97.9°F | Resp 16

## 2019-04-25 DIAGNOSIS — M779 Enthesopathy, unspecified: Secondary | ICD-10-CM

## 2019-04-25 DIAGNOSIS — M79671 Pain in right foot: Secondary | ICD-10-CM | POA: Diagnosis not present

## 2019-04-25 DIAGNOSIS — M722 Plantar fascial fibromatosis: Secondary | ICD-10-CM

## 2019-04-25 DIAGNOSIS — M7670 Peroneal tendinitis, unspecified leg: Secondary | ICD-10-CM

## 2019-04-25 DIAGNOSIS — M7672 Peroneal tendinitis, left leg: Secondary | ICD-10-CM | POA: Diagnosis not present

## 2019-04-25 DIAGNOSIS — M79672 Pain in left foot: Secondary | ICD-10-CM

## 2019-04-25 MED ORDER — PREDNISONE 10 MG PO TABS: ORAL_TABLET | ORAL | 0 refills | Status: DC

## 2019-04-25 NOTE — Progress Notes (Signed)
Subjective:   Patient ID: Erika Mckenzie, female   DOB: 48 y.o.   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  Does not remember injury and points to the right ankle and the outside of the left foot.  States that they are very tender and occasionally she gets shooting pain which is been present for a long time.  Patient smokes quarter pack per day and is not currently active except for work   Review of Systems  All other systems reviewed and are negative.       Objective:  Physical Exam Vitals signs and nursing note reviewed.  Constitutional:      Appearance: She is well-developed.  Pulmonary:     Effort: Pulmonary effort is normal.  Musculoskeletal: Normal range of motion.  Skin:    General: Skin is warm.  Neurological:     Mental Status: She is alert.     Neurovascular status intact muscle strength is found to be adequate range of motion within normal limits.  Patient is found to have inflammation pain of the sinus tarsi right with fluid buildup and on the left lateral foot at the peroneal insertion there is a lot of pain and inflammation present.  Patient is obese which is complicating factor and does have very dry skin bilateral     Assessment:  Acute sinus tarsitis right with inflammation along with peroneal tendinitis left and excessively dry foot structure     Plan:  H&P conditions reviewed and today I did sterile prep injected the sinus tarsi right 3 mg Kenalog 5 mg Xylocaine and on the left I injected the peroneal insertion 3 mg Kenalog 5 mg Xylocaine and applied brace right and left it with instructions on usage.  Placed on Sterapred DS 12-day Dosepak reappoint 2 weeks  X-rays indicated moderate depression of the arch but no indications of arthritis or stress fracture

## 2019-04-25 NOTE — Progress Notes (Signed)
   Subjective:    Patient ID: Erika Mckenzie, female    DOB: April 01, 1971, 48 y.o.   MRN: [id number]  HPI    Review of Systems  All other systems reviewed and are negative.      Objective:   Physical Exam        Assessment & Plan:

## 2019-05-09 ENCOUNTER — Encounter: Payer: Self-pay | Admitting: Podiatry

## 2019-05-09 ENCOUNTER — Ambulatory Visit: Payer: 59 | Admitting: Podiatry

## 2019-05-09 ENCOUNTER — Other Ambulatory Visit: Payer: Self-pay

## 2019-05-09 VITALS — Temp 97.6°F

## 2019-05-09 DIAGNOSIS — M7672 Peroneal tendinitis, left leg: Secondary | ICD-10-CM | POA: Diagnosis not present

## 2019-05-09 DIAGNOSIS — M722 Plantar fascial fibromatosis: Secondary | ICD-10-CM

## 2019-05-09 MED ORDER — DICLOFENAC SODIUM 75 MG PO TBEC: 75.0000 mg | DELAYED_RELEASE_TABLET | Freq: Two times a day (BID) | ORAL | 2 refills | Status: DC

## 2019-05-09 NOTE — Progress Notes (Signed)
Subjective:   Patient ID: Erika Mckenzie, female   DOB: 48 y.o.   MRN: [id number]   HPI Patient states feeling much better with both my feet and I feel like I am walking better and the medications seem to really help me   ROS      Objective:  Physical Exam  Neurovascular status intact with diminished discomfort sinus tarsi right and on the outside the left foot with pain still present upon deep palpation     Assessment:  Doing much better after having inflammatory process bilateral     Plan:  Reviewed condition and recommended the continuation of stretching exercises and placed on diclofenac 75 mg twice daily to continue anti-inflammatory activity.  Reappoint to recheck again in the next 4 to 8 weeks as symptoms indicate

## 2019-05-10 ENCOUNTER — Telehealth: Payer: Self-pay | Admitting: *Deleted

## 2019-05-10 NOTE — Telephone Encounter (Signed)
Pt is requesting some diflucan to be called in because she has been on prednisone for the the last month and now has a yeast infection.  Attempted to call back and schedule but had to LMOVM.  Christen Bame, CMA

## 2019-05-10 NOTE — Telephone Encounter (Signed)
Patient has an appointment on Monday.  Jazmin Hartsell,CMA

## 2019-05-10 NOTE — Telephone Encounter (Signed)
Ms. Erika Mckenzie has not been seen in out clinic for over a year.  I have never met her. Yeast infections do not generally call for emergent treatment.  I would like her to be seen and assessed in clinic before being treated. Please call and have her schedule and appointment.

## 2019-05-13 ENCOUNTER — Telehealth (INDEPENDENT_AMBULATORY_CARE_PROVIDER_SITE_OTHER): Payer: 59 | Admitting: Family Medicine

## 2019-05-13 ENCOUNTER — Other Ambulatory Visit: Payer: Self-pay

## 2019-05-13 DIAGNOSIS — N898 Other specified noninflammatory disorders of vagina: Secondary | ICD-10-CM

## 2019-05-13 MED ORDER — FLUCONAZOLE 150 MG PO TABS: 150.0000 mg | ORAL_TABLET | Freq: Once | ORAL | 0 refills | Status: AC

## 2019-05-13 NOTE — Assessment & Plan Note (Signed)
Patient describes symptoms of vaginal pruritus without discharge/odor/dysuria since Wednesday.  Prescribed patient 1 dose of Diflucan and advised her that if this does not resolve the issue she will need to come in for additional testing.

## 2019-05-13 NOTE — Progress Notes (Signed)
[facility] Telemedicine Visit  Patient consented to have virtual visit. Method of visit: Telephone  Encounter participants: Patient: Erika Mckenzie - located at work Provider: Benay Pike - located at [facility] Others (if applicable):   Chief Complaint: Vaginal pruritus  HPI:  Vaginal itching -patient has been having vaginal pruritus since Wednesday.  She describes it as "annoying".  She denies vaginal discharge/odor.  No abdominal pain.  No nausea vomiting diarrhea or fever.  Patient has been with the same partner for 2 years.  Patient has had yeast infections prior to this.  She believes that is what this is.  She does not have any vaginal lesions that she is aware of.  ROS: per HPI  Pertinent PMHx: History of yeast infection.  Exam:  Respiratory: Speaking full sentences.  No cough.  Assessment/Plan:  Vaginal pruritus Patient describes symptoms of vaginal pruritus without discharge/odor/dysuria since Wednesday.  Prescribed patient 1 dose of Diflucan and advised her that if this does not resolve the issue she will need to come in for additional testing.    Time spent during visit with patient: 7 minutes

## 2019-11-06 ENCOUNTER — Other Ambulatory Visit: Payer: Self-pay | Admitting: Podiatry

## 2020-05-21 ENCOUNTER — Ambulatory Visit: Payer: 59 | Admitting: Podiatry

## 2020-05-21 ENCOUNTER — Encounter: Payer: Self-pay | Admitting: Podiatry

## 2020-05-21 ENCOUNTER — Other Ambulatory Visit: Payer: Self-pay

## 2020-05-21 ENCOUNTER — Other Ambulatory Visit: Payer: Self-pay | Admitting: Podiatry

## 2020-05-21 ENCOUNTER — Ambulatory Visit (INDEPENDENT_AMBULATORY_CARE_PROVIDER_SITE_OTHER): Payer: 59

## 2020-05-21 VITALS — Temp 97.7°F

## 2020-05-21 DIAGNOSIS — M7670 Peroneal tendinitis, unspecified leg: Secondary | ICD-10-CM

## 2020-05-21 DIAGNOSIS — M79672 Pain in left foot: Secondary | ICD-10-CM

## 2020-05-21 DIAGNOSIS — M79671 Pain in right foot: Secondary | ICD-10-CM

## 2020-05-21 DIAGNOSIS — M7661 Achilles tendinitis, right leg: Secondary | ICD-10-CM

## 2020-05-21 DIAGNOSIS — M7672 Peroneal tendinitis, left leg: Secondary | ICD-10-CM

## 2020-05-21 MED ORDER — DICLOFENAC SODIUM 75 MG PO TBEC: 75.0000 mg | DELAYED_RELEASE_TABLET | Freq: Two times a day (BID) | ORAL | 2 refills | Status: DC

## 2020-05-26 NOTE — Progress Notes (Signed)
Subjective:   Patient ID: Erika Mckenzie, female   DOB: 49 y.o.   MRN: [id number]   HPI Patient presents with pain in the outside of the left foot and states that it is been very sore and making it hard to walk consistently.  Patient does not remember injury and does have moderate obesity which is complicating factor   ROS      Objective:  Physical Exam  Neurovascular status intact with inflammation pain around the peroneal insertion as it inserts into base of fifth metatarsal left.  Did not note muscle strength loss or other pathology     Assessment:  Peroneal tendinitis left near insertion base of fifth metatarsal     Plan:  H&P done today I went ahead did sterile prep and did inject the peroneal tendon near insertion 3 mg Dexasone Kenalog 5 mg Xylocaine and advised on supportive shoes ice therapy and will be seen back as symptoms indicate  X-rays were negative for signs of fracture associated with this or other pathology of the bone

## 2020-06-29 ENCOUNTER — Ambulatory Visit (INDEPENDENT_AMBULATORY_CARE_PROVIDER_SITE_OTHER): Payer: 59

## 2020-06-29 DIAGNOSIS — Z23 Encounter for immunization: Secondary | ICD-10-CM

## 2020-06-29 NOTE — Progress Notes (Signed)
Patient presents in nurse clinic for Flu Vaccine.   Vaccine administered RD without complication.  See admin for details.

## 2020-07-28 ENCOUNTER — Ambulatory Visit (INDEPENDENT_AMBULATORY_CARE_PROVIDER_SITE_OTHER): Payer: 59 | Admitting: Family Medicine

## 2020-07-28 ENCOUNTER — Other Ambulatory Visit: Payer: Self-pay

## 2020-07-28 ENCOUNTER — Encounter: Payer: Self-pay | Admitting: Family Medicine

## 2020-07-28 VITALS — BP 118/78 | HR 76 | Ht 63.0 in | Wt 273.0 lb

## 2020-07-28 DIAGNOSIS — J301 Allergic rhinitis due to pollen: Secondary | ICD-10-CM | POA: Diagnosis not present

## 2020-07-28 DIAGNOSIS — R232 Flushing: Secondary | ICD-10-CM

## 2020-07-28 DIAGNOSIS — M25561 Pain in right knee: Secondary | ICD-10-CM | POA: Diagnosis not present

## 2020-07-28 DIAGNOSIS — G8929 Other chronic pain: Secondary | ICD-10-CM

## 2020-07-28 DIAGNOSIS — M25562 Pain in left knee: Secondary | ICD-10-CM

## 2020-07-28 MED ORDER — SUMATRIPTAN SUCCINATE 50 MG PO TABS: ORAL_TABLET | ORAL | 3 refills | Status: DC

## 2020-07-28 MED ORDER — DICLOFENAC SODIUM 75 MG PO TBEC: 75.0000 mg | DELAYED_RELEASE_TABLET | Freq: Two times a day (BID) | ORAL | 2 refills | Status: DC

## 2020-07-28 MED ORDER — CLONIDINE 0.1 MG/24HR TD PTWK: 0.1000 mg | MEDICATED_PATCH | TRANSDERMAL | 12 refills | Status: DC

## 2020-07-28 MED ORDER — CETIRIZINE HCL 10 MG PO TABS: 10.0000 mg | ORAL_TABLET | Freq: Every day | ORAL | 3 refills | Status: DC | PRN

## 2020-07-28 NOTE — Patient Instructions (Signed)
It was great seeing you today!    Visit Remembers: - Stop by the pharmacy to pick up your prescriptions   Regarding lab work today:  Due to recent changes in healthcare laws, you may see the results of your imaging and laboratory studies on MyChart before your provider has had a chance to review them.  I understand that in some cases there may be results that are confusing or concerning to you. Not all laboratory results come back in the same time frame and you may be waiting for multiple results in order to interpret others.  Please give [place] 72 hours in order for your provider to thoroughly review all the results before contacting the office for clarification of your results. If everything is normal, you will get a letter in the mail or a message in My Chart. Please give [place] a call if you do not hear from [place] after 2 weeks.  Please bring all of your medications with you to each visit.    If you haven't already, sign up for My Chart to have easy access to your labs results, and communication with your primary care physician.  Feel free to call with any questions or concerns at any time, at [phone].   Take care,  Dr. Rushie [facility]

## 2020-07-28 NOTE — Progress Notes (Signed)
   SUBJECTIVE:   CHIEF COMPLAINT / HPI:   Chief Complaint  Patient presents with  . Hot Flashes     Erika Mckenzie is a 49 y.o. female here for hot flashes.  Patient reports intermittent hot flashes with night sweats and difficulty sleeping for the past two months.  Reports having a partial hysterectomy. LMP about 2-3 years ago.  Endorses decreased sex drive. Requests something to help with her sx.     PERTINENT  PMH / PSH: reviewed and updated as appropriate   OBJECTIVE:   BP 118/78   Pulse 76   Ht 5\' 3"  (1.6 m)   Wt 273 lb (123.8 kg)   LMP  (LMP Unknown) Comment: Cont Bleeding since 02/2018  SpO2 98%   BMI 48.36 kg/m    GEN: well nourished, well developed female in no acute distress  CV: regular rate and rhythm, no murmurs appreciated  RESP: no increased work of breathing, clear to ascultation bilaterally SKIN: warm, dry NEURO: alert and oriented, moves all extremities appropriately PSYCH: Normal affect, appropriate speech and behavior    ASSESSMENT/PLAN:   Hot flashes Obtain TSH and FSH. Hyperthyroidism vs post menopausal.  - Clonidine 0.1 patch   ALLERGIC RHINITIS, SEASONAL Refilled Zyrtec    PAP due 07/2021 HPV negative and normal   Lyndee Hensen, DO PGY-2, [facility] 07/28/2020

## 2020-07-29 LAB — TSH: TSH: 2.51 u[IU]/mL (ref 0.450–4.500)

## 2020-07-29 LAB — FOLLICLE STIMULATING HORMONE: FSH: 51.3 m[IU]/mL

## 2020-07-30 ENCOUNTER — Encounter: Payer: Self-pay | Admitting: Family Medicine

## 2020-07-30 DIAGNOSIS — R232 Flushing: Secondary | ICD-10-CM | POA: Insufficient documentation

## 2020-07-30 NOTE — Assessment & Plan Note (Addendum)
Refilled Zyrtec

## 2020-07-30 NOTE — Assessment & Plan Note (Signed)
Obtain TSH and FSH. Hyperthyroidism vs post menopausal.  - Clonidine 0.1 patch

## 2020-08-12 ENCOUNTER — Ambulatory Visit: Payer: 59 | Admitting: Podiatry

## 2020-08-12 ENCOUNTER — Encounter: Payer: Self-pay | Admitting: Podiatry

## 2020-08-12 ENCOUNTER — Other Ambulatory Visit: Payer: Self-pay

## 2020-08-12 DIAGNOSIS — M779 Enthesopathy, unspecified: Secondary | ICD-10-CM

## 2020-08-12 MED ORDER — PREDNISONE 10 MG PO TABS: ORAL_TABLET | ORAL | 0 refills | Status: DC

## 2020-08-12 NOTE — Progress Notes (Signed)
Subjective:   Patient ID: Erika Mckenzie, female   DOB: 49 y.o.   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      Objective:  Physical Exam  Neurovascular status intact with exquisite discomfort in the sinus tarsi bilateral with inflammation and is also noted to have mild peroneal tendinitis left but much better than previous     Assessment:  Inflammatory capsulitis sinus tarsi bilateral with acute inflammation     Plan:  Reviewed condition and I am get a placed on a Sterapred DS Dosepak to try to reduce the inflammatory process present and I did do sterile prep and injected the sinus tarsi bilateral 3 mg Kenalog by 1 g liken and patient will be seen back and if symptoms persist quickly we will need to consider blood work to rule out any kind of arthritic flareup

## 2020-08-31 ENCOUNTER — Encounter: Payer: Self-pay | Admitting: Family Medicine

## 2020-10-08 ENCOUNTER — Encounter: Payer: Self-pay | Admitting: Family Medicine

## 2020-10-29 ENCOUNTER — Ambulatory Visit (INDEPENDENT_AMBULATORY_CARE_PROVIDER_SITE_OTHER): Payer: 59 | Admitting: Family Medicine

## 2020-10-29 ENCOUNTER — Other Ambulatory Visit: Payer: Self-pay

## 2020-10-29 DIAGNOSIS — D229 Melanocytic nevi, unspecified: Secondary | ICD-10-CM | POA: Diagnosis not present

## 2020-10-29 NOTE — Assessment & Plan Note (Signed)
Most likely differential is benign nevus. Also considered epidermal cyst however this would expect this to be more deep in the skin. Low suspicion for skin cancer such as basal cell/squamous cell based on examination findings. Discussed options of removing this via shave or punch biopsy today however patient was undecided about this. She would like to think about it and come back if she would like it removed. Follow-up with PCP Dr. Susa Simmonds.

## 2020-10-29 NOTE — Patient Instructions (Signed)
Thank you for coming to see me today. It was a pleasure. The lesion on your scalp is likely a benign mole.  Today we discussed removing it surgically but you chose not to. You can think about and return to the derm clinic if you would like it removed.  Please follow-up with your PCP.  If you have any questions or concerns, please do not hesitate to call the office at [phone].  If you develop fevers>100.5, shortness of breath, chest pain, palpitations, dizziness, abdominal pain, nausea, vomiting, diarrhea or cannot eat or drink then please go to the ER immediately.  Best wishes,   Dr Posey Pronto

## 2020-10-29 NOTE — Progress Notes (Signed)
     SUBJECTIVE:   CHIEF COMPLAINT / HPI:   Erika Mckenzie is a 50 y.o. female presents for concerns for bump at hairline  Skin concern Patient has noticed a small bump over the anterior hairline over the last 2 years. It is non-tender but it is bothering her and is considering get it removed. Denies drainage of fluid or bleeding. Denies bumps like this elsewhere in the body.   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 Also considered epidermal cyst however this would expect this to be more deep in the skin. Low suspicion for skin cancer such as basal cell/squamous cell based on examination findings. Discussed options of removing this via shave or punch biopsy today however patient was undecided about this. She would like to think about it and come back if she would like it removed. Follow-up with PCP Dr. Susa Mckenzie.     Erika Haw, MD PGY-2 [place]

## 2021-01-25 ENCOUNTER — Telehealth: Payer: Self-pay | Admitting: *Deleted

## 2021-01-25 ENCOUNTER — Other Ambulatory Visit: Payer: Self-pay

## 2021-01-25 ENCOUNTER — Ambulatory Visit (INDEPENDENT_AMBULATORY_CARE_PROVIDER_SITE_OTHER): Payer: 59 | Admitting: Family Medicine

## 2021-01-25 VITALS — BP 130/100 | HR 80 | Ht 64.0 in | Wt 275.2 lb

## 2021-01-25 DIAGNOSIS — J301 Allergic rhinitis due to pollen: Secondary | ICD-10-CM

## 2021-01-25 DIAGNOSIS — G43009 Migraine without aura, not intractable, without status migrainosus: Secondary | ICD-10-CM | POA: Diagnosis not present

## 2021-01-25 MED ORDER — SUMATRIPTAN SUCCINATE 50 MG PO TABS: ORAL_TABLET | ORAL | 3 refills | Status: AC

## 2021-01-25 MED ORDER — KETOROLAC TROMETHAMINE 30 MG/ML IJ SOLN
30.0000 mg | Freq: Once | INTRAMUSCULAR | Status: AC
  Administered 2021-01-25: 30 mg via INTRAMUSCULAR

## 2021-01-25 NOTE — Telephone Encounter (Signed)
Received fax from pharmacy, PA needed on sumatriptan.  Clinical questions submitted via Cover My Meds.  Waiting on response, could take up to 72 hours.  Cover My Meds info: Key: [id number]  Christen Bame, CMA

## 2021-01-25 NOTE — Patient Instructions (Signed)
It was nice to meet you today,  I have given you a Toradol shot for your migraine.  Please not take any ibuprofen or Aleve for the rest of the day.  You can also take your Imitrex that I have refilled for you.  Please do not take that medication more than twice in 1 day.  In the future, you can use Tylenol (1000 mg at a time for up to 3 times a day), Aleve, and your Imitrex to get rid of migraines.  Try 1 before switching to another.  If your headaches get more frequent you can talk to your PCP about preventative medications or migraine.  Have a great day,  Clemetine Marker, MD

## 2021-01-25 NOTE — Assessment & Plan Note (Signed)
Headache lasting 5 days with photophobia and phonophobia.  Patient out of her Imitrex at home.  Has not tried Tylenol or NSAIDs other than the Goody's powder.  History and exam consistent with her previous migraines. -Refilled Imitrex -Toradol shot given in office. -Advised patient to follow-up with PCP if these become more frequent. -Advised her to use Tylenol, Aleve, Imitrex for abortive treatment.

## 2021-01-25 NOTE — Progress Notes (Signed)
    SUBJECTIVE:   CHIEF COMPLAINT / HPI:   Migraines: Patient has been experiencing a headache since Thursday.  States it is "all over".  Initially thought it was a sinus headache and was taking Sudafed but this did not improve her symptoms.  Also took a Goody's powder but this did not help either.  Has tried Imitrex in the past and this helped but she was out of that medication.  Has not tried Tylenol or ibuprofen.  Has photophobia and phonophobia but no nausea vomiting or vision changes.  No nasal congestion or rhinorrhea.  PERTINENT  PMH / PSH: Migraines  OBJECTIVE:   BP (!) 130/100   Pulse 80   Ht 5\' 4"  (1.626 m)   Wt 275 lb 4 oz (124.9 kg)   LMP  (LMP Unknown) Comment: Cont Bleeding since 02/2018  SpO2 98%   BMI 47.25 kg/m   General: Alert and oriented.  No acute distress. HEENT: PERRLA, EOMI.  Moist oral mucosa. CV: Regular rate and rhythm, Pulmonary: Lungs clear to auscultation bilaterally   ASSESSMENT/PLAN:   Migraine Headache lasting 5 days with photophobia and phonophobia.  Patient out of her Imitrex at home.  Has not tried Tylenol or NSAIDs other than the Goody's powder.  History and exam consistent with her previous migraines. -Refilled Imitrex -Toradol shot given in office. -Advised patient to follow-up with PCP if these become more frequent. -Advised her to use Tylenol, Aleve, Imitrex for abortive treatment.     Benay Pike, MD [place]

## 2021-01-26 NOTE — Telephone Encounter (Signed)
PA denied:  See info below:  Why was my request denied? This request was denied because you did not meet the following clinical requirements: The requested medication and/or diagnosis are not a covered benefit and excluded from coverage in accordance with the terms and conditions of your plan benefit. Therefore, the request has been administratively denied. [employer] cannot perform this prior authorization request because prior authorization is not required for the requested drug and medication quantities above the benefit limit are excluded under the plan. For additional information on plan benefits, the member can contact Member Services by calling the number on the back of their ID card.

## 2021-01-27 ENCOUNTER — Ambulatory Visit: Payer: 59

## 2021-07-14 ENCOUNTER — Other Ambulatory Visit (HOSPITAL_COMMUNITY)
Admission: RE | Admit: 2021-07-14 | Discharge: 2021-07-14 | Disposition: A | Discharge status: Home / Self Care | Payer: 59 | Source: Ambulatory Visit | Attending: Family Medicine | Admitting: Family Medicine

## 2021-07-14 ENCOUNTER — Other Ambulatory Visit: Payer: Self-pay

## 2021-07-14 ENCOUNTER — Ambulatory Visit (INDEPENDENT_AMBULATORY_CARE_PROVIDER_SITE_OTHER): Payer: 59 | Admitting: Family Medicine

## 2021-07-14 ENCOUNTER — Encounter: Payer: Self-pay | Admitting: Family Medicine

## 2021-07-14 VITALS — BP 141/84 | HR 82 | Ht 64.0 in | Wt 273.4 lb

## 2021-07-14 DIAGNOSIS — Z113 Encounter for screening for infections with a predominantly sexual mode of transmission: Secondary | ICD-10-CM

## 2021-07-14 DIAGNOSIS — Z1211 Encounter for screening for malignant neoplasm of colon: Secondary | ICD-10-CM

## 2021-07-14 DIAGNOSIS — Z1159 Encounter for screening for other viral diseases: Secondary | ICD-10-CM | POA: Diagnosis not present

## 2021-07-14 DIAGNOSIS — Z23 Encounter for immunization: Secondary | ICD-10-CM | POA: Diagnosis not present

## 2021-07-14 DIAGNOSIS — Z0000 Encounter for general adult medical examination without abnormal findings: Secondary | ICD-10-CM

## 2021-07-14 DIAGNOSIS — Z720 Tobacco use: Secondary | ICD-10-CM

## 2021-07-14 MED ORDER — VARENICLINE TARTRATE 0.5 MG PO TABS: 0.5000 mg | ORAL_TABLET | Freq: Two times a day (BID) | ORAL | 1 refills | Status: DC

## 2021-07-14 NOTE — Patient Instructions (Signed)
It was great seeing you today!  Please check-out at the front desk before leaving the clinic. I'd like to see you back in 6 months  but if you need to be seen earlier than that for any new issues we're happy to fit you in, just give [place] a call!  Today at your annual preventive visit we talked about the following measures:   I recommend 150 minutes of exercise per week-try 30 minutes 5 days per week We discussed reducing sugary beverages (like soda and juice) and increasing leafy greens and whole fruits.  We discussed avoiding tobacco and alcohol.  I recommend avoiding illicit substances.  Your blood pressure is at goal ( <120/80).   Regarding lab work today:  Due to recent changes in healthcare laws, you may see the results of your imaging and laboratory studies on MyChart before your provider has had a chance to review them.  I understand that in some cases there may be results that are confusing or concerning to you. Not all laboratory results come back in the same time frame and you may be waiting for multiple results in order to interpret others.  Please give [place] 72 hours in order for your provider to thoroughly review all the results before contacting the office for clarification of your results. If everything is normal, you will get a letter in the mail or a message in My Chart. Please give [place] a call if you do not hear from [place] after 2 weeks.  Please bring all of your medications with you to each visit.     Take care,  Dr. [facility]

## 2021-07-14 NOTE — Progress Notes (Signed)
    SUBJECTIVE:   Chief compliant/HPI: annual examination  Erika Mckenzie is a 50 y.o. who presents today for an annual exam.   History tabs reviewed and updated.  Patient smokes 0.5 ppd for the last 30 years. She has tried cold [place] quitting several times but was unsuccessful. She would like to try Chantix.   Review of systems form reviewed and notable for none.     OBJECTIVE:   BP (!) 141/84   Pulse 82   Ht 5\' 4"  (1.626 m)   Wt 273 lb 6 oz (124 kg)   LMP  (LMP Unknown) Comment: Cont Bleeding since 02/2018  SpO2 97%   BMI 46.92 kg/m    GEN: well appearing female in no acute distress  CVS: well perfused  RESP: speaking in full sentences without pause  ABD: soft, non-tender, non-distended, no palpable masses  Pelvic exam: normal external genitalia, vulva, VAGINA normal appearing without discharge or lesions, No cervix found hx of hysterectomy, GC/CT DNA probe collected. ADNEXA: normal adnexa in size, nontender and no masses, exam chaperoned by CMA Alexis.    ASSESSMENT/PLAN:   Tobacco abuse Smoking cessation instruction/counseling given:  counseled patient on the dangers of tobacco use, advised patient to stop smoking, and reviewed strategies to maximize success. Patient willing to try Chantix. Start 0.5 mg for 3 days then increase to 0.5 mg BID for 8 weeks.    Annual Examination  See AVS for age appropriate recommendations.   PHQ score 0, reviewed and discussed.  Blood pressure reviewed and at goal.  Asked about intimate partner violence and resources given as appropriate  Had a hysterectomy. No future Pap's needed.  Considered the following items based upon USPSTF recommendations: Diabetes screening: discussed and ordered Screening for elevated cholesterol: discussed and ordered HIV testing: discussed and ordered Hepatitis C: discussed and ordered Hepatitis B: discussed Syphilis if at high risk: discussed and ordered GC/CT at high risk, ordered.  Reviewed risk  factors for latent tuberculosis and not indicated Reviewed risk factors for osteoporosis. Early screening not indicated, not ordered   No family hx of breast cancer. Had mammogram at her job's mobile clinic. Reports results were normal last year.  Cervical cancer screening: hysterectomy, no cervix seen on exam Breast cancer screening:  discussed. Screening UTD. Colorectal cancer screening: discussed, colonoscopy ordered if age 71 or over.   Follow up in 6 months for pre-diabetes.    Lyndee Hensen, [place]

## 2021-07-15 LAB — HEMOGLOBIN A1C
Est. average glucose Bld gHb Est-mCnc: 126 mg/dL
Hgb A1c MFr Bld: 6 % — ABNORMAL HIGH (ref 4.8–5.6)

## 2021-07-15 LAB — RPR: RPR Ser Ql: NONREACTIVE

## 2021-07-15 LAB — CERVICOVAGINAL ANCILLARY ONLY
Chlamydia: NEGATIVE
Comment: NEGATIVE
Comment: NEGATIVE
Comment: NORMAL
Neisseria Gonorrhea: NEGATIVE
Trichomonas: NEGATIVE

## 2021-07-15 LAB — LIPID PANEL
Chol/HDL Ratio: 2.8 ratio (ref 0.0–4.4)
Cholesterol, Total: 179 mg/dL (ref 100–199)
HDL: 65 mg/dL (ref >39)
LDL Chol Calc (NIH): 95 mg/dL (ref 0–99)
Triglycerides: 105 mg/dL (ref 0–149)
VLDL Cholesterol Cal: 19 mg/dL (ref 5–40)

## 2021-07-15 LAB — HIV ANTIBODY (ROUTINE TESTING W REFLEX): HIV Screen 4th Generation wRfx: NONREACTIVE

## 2021-07-15 LAB — HCV AB W REFLEX TO QUANT PCR: HCV Ab: 0.1 s/co ratio (ref 0.0–0.9)

## 2021-07-15 LAB — HCV INTERPRETATION

## 2021-07-18 ENCOUNTER — Encounter: Payer: Self-pay | Admitting: Family Medicine

## 2021-07-18 NOTE — Assessment & Plan Note (Signed)
Smoking cessation instruction/counseling given:  counseled patient on the dangers of tobacco use, advised patient to stop smoking, and reviewed strategies to maximize success. Patient willing to try Chantix. Start 0.5 mg for 3 days then increase to 0.5 mg BID for 8 weeks.

## 2021-07-19 ENCOUNTER — Telehealth: Payer: Self-pay

## 2021-07-19 NOTE — Telephone Encounter (Signed)
Received fax from pharmacy, PA needed on Chantix.  Clinical questions submitted via Cover My Meds.  Waiting on response, could take up to 72 hours.  Cover My Meds info: Key: [id number]  Talbot Grumbling, RN

## 2022-03-15 ENCOUNTER — Encounter: Payer: Self-pay | Admitting: *Deleted

## 2022-04-25 ENCOUNTER — Ambulatory Visit (INDEPENDENT_AMBULATORY_CARE_PROVIDER_SITE_OTHER): Payer: 59 | Admitting: Family Medicine

## 2022-04-25 ENCOUNTER — Encounter: Payer: Self-pay | Admitting: Family Medicine

## 2022-04-25 VITALS — BP 133/87 | HR 75 | Wt 275.2 lb

## 2022-04-25 DIAGNOSIS — J301 Allergic rhinitis due to pollen: Secondary | ICD-10-CM | POA: Diagnosis not present

## 2022-04-25 DIAGNOSIS — M79652 Pain in left thigh: Secondary | ICD-10-CM | POA: Diagnosis not present

## 2022-04-25 DIAGNOSIS — G5602 Carpal tunnel syndrome, left upper limb: Secondary | ICD-10-CM | POA: Diagnosis not present

## 2022-04-25 MED ORDER — CETIRIZINE HCL 10 MG PO TABS: 10.0000 mg | ORAL_TABLET | Freq: Every day | ORAL | 3 refills | Status: AC

## 2022-04-25 MED ORDER — FLUTICASONE PROPIONATE 50 MCG/ACT NA SUSP: 2.0000 | Freq: Every day | NASAL | 6 refills | Status: AC

## 2022-04-25 NOTE — Patient Instructions (Signed)
It was great to see you again today!  Referring to sports medicine Take ibuprofen '400mg'$  every 6 hours as needed for pain - see if this helps Can also try heating pad and/or ice  Wear splint on L wrist at night.  For allergies take zyrtec '10mg'$  daily and flonase spray daily  Be well, Dr. Ardelia Mems

## 2022-04-25 NOTE — Assessment & Plan Note (Addendum)
-  Increased congestion today. Start taking Cetirizine daily. Start Flonase daily.

## 2022-04-25 NOTE — Progress Notes (Signed)
  Date of Visit: 04/25/2022   HPI:  Erika Mckenzie is a 51 y.o. female with a PMHx of tobacco use and migraines who presents to clinic for 10-14 days of right groin/medial thigh pain and right posterior lateral thigh pain.  R groin/medial thigh pain She reports no specific inciting event. 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 She reports no increase in activity recently. Pain in right thigh starts in the upper inner thigh and shoots down to lower thigh. Pain is sharp and constant. Pain is worse with exertion and movement. She has not tried anything to help relieve the pain. Pain is 10/10 and affects her sleep. She denies vaginal discharge, pelvic pain, and dysuria.  R posterior lateral thigh pain She reports R posterior lateral thigh pain is like a pulling sensation. And is worse with exertion and movement.   L hand tingling and numbness She has a history of carpal tunnel. Symptoms have been worsening especially at night. Symptoms are worse in left than right hand.  Also notes congestion, began worsening yesterday. History of allergies. Takes zyrtec sometimes, not daily. No fever or cough.   PHYSICAL EXAM: BP 133/87   Pulse 75   Wt 275 lb 3.2 oz (124.8 kg)   LMP  (LMP Unknown) Comment: Cont Bleeding since 02/2018  SpO2 100%   BMI 47.24 kg/m  Gen: Well appearing female in no acute distress. Ext: mild tenderness to palpation of R inner thigh. R posterior lateral thigh pain is non-tender. Pain with thigh flexion. Full ROM with passive internal & external hip rotation on R, without pain. Normal sensation bilaterally. No R or L calf tenderness or pain. No calf swelling. Positive Tinel and Phalen's on L. No thenar atrophy, grip 5/5 on L. Pulm: Normal respiratory effort.   ASSESSMENT/PLAN:  R groin/medial and posterior lateral thigh pain -New, suspect muscular etiology. Gave options of conservative treatment or referral to sports medicine. Given impact on function at work,  patient requests to do both. - Try ibuprofen, heat, and ice.  - Sports medicine referral placed.  Carpal tunnel syndrome Worsening in L hand. Provided L wrist cockup splint to manage symptoms - advised wearing nightly  ALLERGIC RHINITIS, SEASONAL -Increased congestion today. Start taking Cetirizine daily. Start Flonase daily.  Leonette Nutting, [facility]  Patient seen along with MS3 student Leonette Nutting. I personally evaluated this patient along with the student, and verified all aspects of the history, physical exam, and medical decision making as documented by the student. I agree with the student's documentation and have made all necessary edits.  Chrisandra Netters, MD  [place]

## 2022-04-25 NOTE — Assessment & Plan Note (Addendum)
Worsening in L hand. Provided L wrist cockup splint to manage symptoms - advised wearing nightly

## 2022-05-03 ENCOUNTER — Ambulatory Visit: Payer: 59 | Admitting: Sports Medicine

## 2022-05-06 ENCOUNTER — Encounter: Payer: Self-pay | Admitting: Family Medicine

## 2022-05-06 ENCOUNTER — Ambulatory Visit (INDEPENDENT_AMBULATORY_CARE_PROVIDER_SITE_OTHER): Payer: 59 | Admitting: Family Medicine

## 2022-05-06 VITALS — BP 137/92 | Ht 64.0 in | Wt 270.0 lb

## 2022-05-06 DIAGNOSIS — M25561 Pain in right knee: Secondary | ICD-10-CM

## 2022-05-06 MED ORDER — AMITRIPTYLINE HCL 10 MG PO TABS: 10.0000 mg | ORAL_TABLET | Freq: Every day | ORAL | 1 refills | Status: DC

## 2022-05-06 MED ORDER — METHYLPREDNISOLONE ACETATE 40 MG/ML IJ SUSP
40.0000 mg | Freq: Once | INTRAMUSCULAR | Status: AC
  Administered 2022-05-06: 40 mg via INTRA_ARTICULAR

## 2022-05-06 NOTE — Patient Instructions (Signed)
Today I injected your right knee.  I would like to see you back in 2 to 3 weeks.  If you have any new or worsening symptoms in the interim, please give [place] a call.  I   have also sent in a prescription for 25 mg of amitriptyline.  I find this medicine very useful for the type of pain you are having and it usually helps you sleep.  Some people have quite a bit of fuzzy sensation in the morning after they take the first couple of doses so I would recommend you try it on a day when you do not have to get up really early the next morning.  If this does not work for you, please let me know.  I will see you back in 2 or 3 weeks.  Great to meet you!

## 2022-05-06 NOTE — Progress Notes (Signed)
  Erika Mckenzie - 51 y.o. female MRN [id number]  Date of birth: [birth year]    SUBJECTIVE:      Chief Complaint:/ HPI:  Right posterior thigh pain for several weeks now.  Keeping her awake at night.  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  Unfortunately when she gets home and tries to sleep it keeps her up.  No specific injury.  Reports that she has had problems off and on particularly early in her life with some knee issues and they used to swell all the time.  Now the right one feels like it swelling a little bit more than it used to.    OBJECTIVE: BP (!) 137/92   Ht '5\' 4"'$  (1.626 m)   Wt 270 lb (122.5 kg)   LMP  (LMP Unknown) Comment: Cont Bleeding since 02/2018  BMI 46.35 kg/m   Physical Exam:  Vital signs are reviewed. GENERAL: Well-developed female no acute distress Knees: Symmetrical.  Full range of motion flexion extension.  Ligamentously intact to varus and valgus stress on left and right side.  Popliteal space on the right is slightly full and there is discrete tenderness to palpation about 2 cm above the popliteal space in the midline right between the insertion points of the to hamstrings.  This reproduces her pain some.  There is also some tenderness to palpation in the mid part of the muscle belly of the semimembranosus muscle. Right knee has some mild tenderness to palpation of the medial joint line.  The right joint line is nontender to palpation.  Pain with McMurray testing but no click.  PROCEDURE: INJECTION: Patient was given informed consent, signed copy in the chart. Appropriate time out was taken. Area prepped and draped in usual sterile fashion. Ethyl chloride was  used for local anesthesia. A 21 gauge 1 1/2 inch needle was used..  1 cc of methylprednisolone 40 mg/ml plus 4 cc of 1% lidocaine without epinephrine was injected into the right knee using a(n) anterior medial approach.   The patient tolerated the  procedure well. There were no complications. Post procedure instructions were given.   ASSESSMENT & PLAN: #1.  Right knee and right posterior thigh pain: I suspect this is all related to chronic meniscal issues on the right side.  T he right popliteal space is a little bit full and I suspect she has a bursa in between the 2 hamstrings.  We discussed meniscal pathology at length.    I would start with a corticosteroid injection of the right knee joint (to be both therapeutic and diagnostic)and then follow her up in a few weeks.  If she is not improving, then I think we are looking at getting an MRI of the knee to look at the meniscus.   We could also consider doing ultrasound of the popliteal space but I do not think that is really going to help [place] a whole lot.  For her nighttime pain, I will try her on amitriptyline.  She is cautioned that it may cause hangover effect so I would try it on night when she does not have to work the next morning.  She is allergic to tramadol.  If the amitriptyline does not work, could consider gabapentin etc.  She will call back if the amitriptyline does not help. See problem based charting & AVS for pt instructions. No problem-specific Assessment & Plan notes found for this encounter.

## 2022-05-27 ENCOUNTER — Ambulatory Visit: Payer: 59 | Admitting: Family Medicine

## 2022-08-05 ENCOUNTER — Ambulatory Visit
Admission: RE | Admit: 2022-08-05 | Discharge: 2022-08-05 | Disposition: A | Discharge status: Home / Self Care | Payer: 59 | Source: Ambulatory Visit | Attending: Family Medicine | Admitting: Family Medicine

## 2022-08-05 ENCOUNTER — Ambulatory Visit (INDEPENDENT_AMBULATORY_CARE_PROVIDER_SITE_OTHER): Payer: 59 | Admitting: Family Medicine

## 2022-08-05 VITALS — BP 151/90 | Ht 64.5 in | Wt 278.0 lb

## 2022-08-05 DIAGNOSIS — G8929 Other chronic pain: Secondary | ICD-10-CM

## 2022-08-05 DIAGNOSIS — M25561 Pain in right knee: Secondary | ICD-10-CM | POA: Diagnosis not present

## 2022-08-05 MED ORDER — HYDROCODONE-ACETAMINOPHEN 5-325 MG PO TABS: ORAL_TABLET | ORAL | 0 refills | Status: DC

## 2022-08-05 NOTE — Patient Instructions (Signed)
I think it is time to move forward with more imaging of your knee.  I suspect you have some significant arthritis under the kneecap.  The only way really to look at that is MRI.  I will get some films today as well.  I have given you some narcotics to use at night for pain.  I will call you within 2 days of getting your MRI.  If you do not hear from me, please call the office.  Great to see you!  Also ask your primary care provider to refer you to Dr. Valentina Lucks for smoking cessation clinic.

## 2022-08-05 NOTE — Assessment & Plan Note (Signed)
Bilateral but right greater than left knee pain.  Much worse recently.  Corticosteroid injection that we did in June was not very helpful.  At this point is really impacting her ADLs and causing her significant pain when she tries to sleep.  .  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  Review of her old x-rays show some osteophyte formation.  New x-rays ordered and again show significant patellofemoral joint osteoarthritis.  MRI will be necessary.  I will give her hydrocodone for the next 3 to 4 weeks that we can get some resolution with a plan for her knee pain.

## 2022-08-05 NOTE — Progress Notes (Signed)
  BRIAN KOCOUREK - 51 y.o. female MRN [id number]  Date of birth: 11/20/1970    SUBJECTIVE:      Chief Complaint:/ HPI:  Follow-up right knee pain.  We had tried corticosteroid injection at last office visit and that helped for about a week to 10 days.  Since then she has had significant pain.  She works in a [employer] information [employer] manager and then today she is having significant pain.  So much pain that she is not able to do many things during the evening having difficulty sleeping.  A lot of pain with going up and down stairs and she hears a lot of popping.  X-rays from 2019 reviewed which show some osteophytes.  Mild joint space narrowing.  Fully on the right knee.  OBJECTIVE: BP (!) 151/90   Ht 5' 4.5" (1.638 m)   Wt 278 lb (126.1 kg)   LMP  (LMP Unknown) Comment: Cont Bleeding since 02/2018  BMI 46.98 kg/m   Physical Exam:  Vital signs are reviewed. GENERAL: Well-developed female no acute distress KNEE: Right.  Positive patellar compression test.  Ligamentously intact to varus and valgus stress.  The popliteal space feels somewhat firm with fluid in the area.  She can flex and extend fully.  She has a lot of crepitus and popping with extension.  Anterior drawer is normal.  ASSESSMENT & PLAN:  See problem based charting & AVS for pt instructions. Chronic knee pain Bilateral but right greater than left knee pain.  Much worse recently.  Corticosteroid injection that we did in June was not very helpful.  At this point is really impacting her ADLs and causing her significant pain when she tries to sleep.  .  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  Review of her old x-rays show some osteophyte formation.  New x-rays ordered and again show significant patellofemoral joint osteoarthritis.  MRI will be necessary.  I will give her hydrocodone for the next 3 to 4 weeks that we can get some resolution with a plan for her knee pain.

## 2022-08-08 ENCOUNTER — Telehealth: Payer: Self-pay

## 2022-08-08 NOTE — Telephone Encounter (Signed)
Megan I do not know what her plan would cover. Does she have a formulary? There may not be a chronic pain med listed on herplan and she would have to pay for it out of pocket.If she has a formulary or can askher pharmacist what the plan does cover, please let me know THANKS! Dorcas Mcmurray

## 2022-08-09 ENCOUNTER — Other Ambulatory Visit (HOSPITAL_COMMUNITY): Payer: Self-pay

## 2022-08-09 NOTE — Telephone Encounter (Signed)
I rec'd a PA request from [facility]... would you like me to attempt to get it covered?

## 2022-08-10 ENCOUNTER — Other Ambulatory Visit (HOSPITAL_COMMUNITY): Payer: Self-pay

## 2022-08-10 NOTE — Telephone Encounter (Signed)
Medication doesn't require a prior auth.   Insurance only covers a 7 day supply at a time of this medication. Once this supply is filled, a new rx will be needed for further fills (CII's cannot be refilled).  Pharmacy filling 7 day supply for patient.  Will follow up with patient as well.

## 2022-08-10 NOTE — Telephone Encounter (Signed)
Left message informing patient of previous info.

## 2022-08-16 ENCOUNTER — Other Ambulatory Visit: Payer: Self-pay | Admitting: Family Medicine

## 2022-08-27 ENCOUNTER — Ambulatory Visit
Admission: RE | Admit: 2022-08-27 | Discharge: 2022-08-27 | Disposition: A | Discharge status: Home / Self Care | Payer: 59 | Source: Ambulatory Visit | Attending: Family Medicine | Admitting: Family Medicine

## 2022-08-27 DIAGNOSIS — G8929 Other chronic pain: Secondary | ICD-10-CM

## 2022-08-30 ENCOUNTER — Telehealth: Payer: Self-pay | Admitting: Family Medicine

## 2022-08-30 NOTE — Telephone Encounter (Signed)
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 She probably needs to see ortho. MIGHT be candidate for arthroscope but more likl;ey she islookingat TKR. No wonder it hurts! THANKS! If she wants, go ahead and send toortho. Ninfa Linden? Unless she has preference or prior ortho surgeon THANKS! Dorcas Mcmurray

## 2022-09-05 ENCOUNTER — Other Ambulatory Visit: Payer: Self-pay | Admitting: *Deleted

## 2022-09-05 ENCOUNTER — Encounter: Payer: Self-pay | Admitting: *Deleted

## 2022-09-05 DIAGNOSIS — M25561 Pain in right knee: Secondary | ICD-10-CM

## 2022-09-05 DIAGNOSIS — G8929 Other chronic pain: Secondary | ICD-10-CM

## 2022-09-07 ENCOUNTER — Ambulatory Visit (INDEPENDENT_AMBULATORY_CARE_PROVIDER_SITE_OTHER): Payer: 59 | Admitting: Orthopaedic Surgery

## 2022-09-07 VITALS — Ht 64.5 in | Wt 278.0 lb

## 2022-09-07 DIAGNOSIS — M1711 Unilateral primary osteoarthritis, right knee: Secondary | ICD-10-CM | POA: Diagnosis not present

## 2022-09-07 DIAGNOSIS — G8929 Other chronic pain: Secondary | ICD-10-CM

## 2022-09-07 DIAGNOSIS — M25561 Pain in right knee: Secondary | ICD-10-CM

## 2022-09-07 MED ORDER — CELECOXIB 200 MG PO CAPS: 200.0000 mg | ORAL_CAPSULE | Freq: Two times a day (BID) | ORAL | 3 refills | Status: AC | PRN

## 2022-09-07 MED ORDER — METHOCARBAMOL 500 MG PO TABS: 500.0000 mg | ORAL_TABLET | Freq: Four times a day (QID) | ORAL | 1 refills | Status: DC | PRN

## 2022-09-07 NOTE — Progress Notes (Signed)
The patient is sometimes seen for the first time.  She is sent from Dr. Dorcas Mcmurray to evaluate and treat severe right knee pain and known osteoarthritis of the knee.  She has had steroid injections in her knee several times and hyaluronic acid as well as flex a genics.  None of this is helped her.  She has pain mainly at night and with weightbearing.  She is not diabetic.  However her BMI today is 46.98.  On exam she does have knees that both hyperextend.  Both knees have valgus malalignment.  X-rays and the MRI on the canopy system show moderate arthritis of her right knee with no significant meniscal tearing.  I did talk to her in length in detail about her right knee.  I think the only surgical treatment that would help her is a knee replacement.  However given her BMI of almost 47, we would not be able to schedule her for knee replacement till she loses more significant weight.  I did explain this to her in detail.  I would like to see her back in 3 months for repeat weight and BMI calculation.  I will send in some anti-inflammatories and muscle relaxants see if this will help her knee.  All question concerns were answered and addressed.

## 2023-05-23 ENCOUNTER — Ambulatory Visit: Payer: BC Managed Care – PPO | Admitting: Family Medicine

## 2023-05-23 ENCOUNTER — Encounter: Payer: Self-pay | Admitting: Family Medicine

## 2023-05-23 ENCOUNTER — Other Ambulatory Visit: Payer: Self-pay

## 2023-05-23 VITALS — BP 127/76 | Ht 64.5 in | Wt 265.0 lb

## 2023-05-23 DIAGNOSIS — M1711 Unilateral primary osteoarthritis, right knee: Secondary | ICD-10-CM | POA: Diagnosis not present

## 2023-05-23 DIAGNOSIS — M25561 Pain in right knee: Secondary | ICD-10-CM | POA: Diagnosis not present

## 2023-05-23 DIAGNOSIS — G8929 Other chronic pain: Secondary | ICD-10-CM | POA: Diagnosis not present

## 2023-05-23 MED ORDER — METHYLPREDNISOLONE ACETATE 40 MG/ML IJ SUSP
40.0000 mg | Freq: Once | INTRAMUSCULAR | Status: AC
  Administered 2023-05-23: 40 mg via INTRA_ARTICULAR

## 2023-05-23 NOTE — Progress Notes (Addendum)
PCP: Alfredo Martinez, MD  Subjective:   HPI: Patient is a 52 y.o. female here for right knee pain.  Patient has known history of right knee arthritis. Current pain has been progressively worsening over several months. Cannot sleep due to pain. No injury or trauma. Pain posterior knee mostly. Some instability of this knee.  Past Medical History:  Diagnosis Date   Anemia    GERD (gastroesophageal reflux disease)    otc med prn   Migraines    Seasonal allergies    SVD (spontaneous vaginal delivery)    x 2    Current Outpatient Medications on File Prior to Visit  Medication Sig Dispense Refill   amitriptyline (ELAVIL) 10 MG tablet TAKE 1 TABLET(10 MG) BY MOUTH AT BEDTIME 30 tablet 1   celecoxib (CELEBREX) 200 MG capsule Take 1 capsule (200 mg total) by mouth 2 (two) times daily between meals as needed. 60 capsule 3   cetirizine (ZYRTEC) 10 MG tablet Take 1 tablet (10 mg total) by mouth daily. 30 tablet 3   fluticasone (FLONASE) 50 MCG/ACT nasal spray Place 2 sprays into both nostrils daily. 16 g 6   methocarbamol (ROBAXIN) 500 MG tablet Take 1 tablet (500 mg total) by mouth every 6 (six) hours as needed. 40 tablet 1   SUMAtriptan (IMITREX) 50 MG tablet TAKE 1 TABLET BY MOUTH EVERY 2 HOURS AS NEEDED FOR MIGRAINE 30 tablet 3   No current facility-administered medications on file prior to visit.    Past Surgical History:  Procedure Laterality Date   CHOLECYSTECTOMY     DILATION AND CURETTAGE OF UTERUS     MAB   TONSILLECTOMY     TUBAL LIGATION     VAGINAL HYSTERECTOMY Bilateral 06/19/2018   Procedure: HYSTERECTOMY VAGINAL WITH  Right SALPINGECTOMY;  Surgeon: Allie Bossier, MD;  Location: [facility];  Service: Gynecology;  Laterality: Bilateral;   WISDOM TOOTH EXTRACTION      Allergies  Allergen Reactions   Tramadol Hives    BP 127/76   Ht 5' 4.5" (1.638 m)   Wt 265 lb (120.2 kg)   LMP  (LMP Unknown) Comment: Cont Bleeding since 02/2018  BMI 44.78 kg/m       No data to  display              No data to display              Objective:  Physical Exam:  Gen: NAD, comfortable in exam room  Right knee: No gross deformity, ecchymoses.  Mild effusion. TTP popliteal fossa.  No joint line, other tenderness. FROM with normal strength. Negative ant/post drawers. Negative valgus/varus testing for laxity though felt unstable with varus testing laterally. Negative lachman.  Negative mcmurrays, apleys.  NV intact distally.   Limited MSK u/s right knee:  Mild effusion.  Lateral > medial arthropathy.  Quad and patellar tendons intact.  Very small bakers cyst.  Assessment & Plan:  1. Right knee pain - 2/2 severe arthritis with effusion.  Discussed options - went ahead with aspiration and injection as below today.  Tylenol, topical medications, supplements reviewed.  Home exercises demonstrated.  She would benefit from knee brace for support and stability but having trouble finding one that fits properly - will check into custom fitted brace.  Due to quad: calf ratio patient would not fit into our stock braces and would need custom fit bracing.  After informed written consent timeout was performed, patient was lying supine on exam table.  Right knee was prepped with alcohol swab.  Utilizing superolateral approach, 3 mL of lidocaine was used for local anesthesia.  Then using an 18g needle on 50cc syringe with ultrasound guidance, 18 mL of clear straw-colored fluid was aspirated from right knee.  Knee was then injected with 3:1 lidocaine:depomedrol.  Patient tolerated procedure well without immediate complications.

## 2023-05-23 NOTE — Patient Instructions (Addendum)
Your pain is due to arthritis. These are the different medications you can take for this: Tylenol 500mg  1-2 tabs three times a day for pain. Capsaicin, aspercreme, or biofreeze topically up to four times a day may also help with pain. Some supplements that may help for arthritis: Boswellia extract, curcumin, pycnogenol Aleve 1-2 tabs twice a day with food as needed Cortisone injections are an option - we did this after draining your knee. It's important that you continue to stay active. Straight leg raises, knee extensions 3 sets of 10 once a day (add ankle weight if these become too easy). Consider physical therapy to strengthen muscles around the joint that hurts to take pressure off of the joint itself. Shoe inserts with good arch support may be helpful. Heat or ice 15 minutes at a time 3-4 times a day as needed to help with pain. Water aerobics and cycling with low resistance are the best two types of exercise for arthritis though any exercise is ok as long as it doesn't worsen the pain. Continue working on weight loss. Follow up with me as needed.

## 2023-06-21 ENCOUNTER — Encounter: Payer: Self-pay | Admitting: Family Medicine

## 2023-06-21 ENCOUNTER — Ambulatory Visit (INDEPENDENT_AMBULATORY_CARE_PROVIDER_SITE_OTHER): Payer: BC Managed Care – PPO | Admitting: Family Medicine

## 2023-06-21 VITALS — BP 106/47 | Ht 64.0 in | Wt 260.0 lb

## 2023-06-21 DIAGNOSIS — M25551 Pain in right hip: Secondary | ICD-10-CM | POA: Diagnosis not present

## 2023-06-21 DIAGNOSIS — M1711 Unilateral primary osteoarthritis, right knee: Secondary | ICD-10-CM | POA: Diagnosis not present

## 2023-06-21 DIAGNOSIS — M216X9 Other acquired deformities of unspecified foot: Secondary | ICD-10-CM | POA: Diagnosis not present

## 2023-06-21 NOTE — Progress Notes (Signed)
Erika Mckenzie - 52 y.o. female MRN [id number]  Date of birth: Sep 23, 1971  PCP: Alfredo Martinez, MD  Subjective:  No chief complaint on file.  R knee and lateral hip pain  HPI: Past Medical, Surgical, Social, and Family History Reviewed & Updated per EMR.   Patient is a 52 y.o. female here for follow up on right knee and lateral hip pain, last seen on 05/23/2023.  She received steroid injection into right knee after aspiration at that point which gave about a week of relief, but now her pain has returned but only in the posterior knee.  She is not having any numbness or weakness distally.  She is also having right lateral thigh pain worse when walking up and down stairs.  There has been no swelling, fever, or chills.    Past Medical History:  Diagnosis Date   Anemia    GERD (gastroesophageal reflux disease)    otc med prn   Migraines    Seasonal allergies    SVD (spontaneous vaginal delivery)    x 2    Current Outpatient Medications on File Prior to Visit  Medication Sig Dispense Refill   amitriptyline (ELAVIL) 10 MG tablet TAKE 1 TABLET(10 MG) BY MOUTH AT BEDTIME 30 tablet 1   celecoxib (CELEBREX) 200 MG capsule Take 1 capsule (200 mg total) by mouth 2 (two) times daily between meals as needed. 60 capsule 3   cetirizine (ZYRTEC) 10 MG tablet Take 1 tablet (10 mg total) by mouth daily. 30 tablet 3   fluticasone (FLONASE) 50 MCG/ACT nasal spray Place 2 sprays into both nostrils daily. 16 g 6   methocarbamol (ROBAXIN) 500 MG tablet Take 1 tablet (500 mg total) by mouth every 6 (six) hours as needed. 40 tablet 1   SUMAtriptan (IMITREX) 50 MG tablet TAKE 1 TABLET BY MOUTH EVERY 2 HOURS AS NEEDED FOR MIGRAINE 30 tablet 3   No current facility-administered medications on file prior to visit.    Past Surgical History:  Procedure Laterality Date   CHOLECYSTECTOMY     DILATION AND CURETTAGE OF UTERUS     MAB   TONSILLECTOMY     TUBAL LIGATION     VAGINAL HYSTERECTOMY Bilateral  06/19/2018   Procedure: HYSTERECTOMY VAGINAL WITH  Right SALPINGECTOMY;  Surgeon: Allie Bossier, MD;  Location: [facility];  Service: Gynecology;  Laterality: Bilateral;   WISDOM TOOTH EXTRACTION      Allergies  Allergen Reactions   Tramadol Hives        Objective:  Physical Exam: VS: BP:(!) 106/47  HR: bpm  TEMP: ( )  RESP:   HT:5\' 4"  (162.6 cm)   WT:260 lb (117.9 kg)  BMI:44.61  Gen: NAD, speaks clearly, comfortable in exam room Respiratory: Normal respiratory effort on room air. No signs of distress Skin: No rashes, abrasions, or ecchymosis MSK: Antalgic gait favoring right side The right hip has full passive range of motion with some mild lateral thigh tenderness elicited on internal rotation. Strength 5/5 in hip flexion.  Hip abduction 3/5 strength on the right, 4/5 on the left. Trendelenburg positive on the right There is no tenderness to palpation over the anterior hip joint. There is no appreciable edema, or warmth.  There is full flexion and extension of the knee with crepitus palpated. No effusions present. There is a palpable Baker's cyst present on the right which is mildly tender Apley's compression test negative. Strength 5/5 in all directions. Inspection of the feet while standing shows pronation  at the ankles There are no sensory deficits distally   Assessment & Plan:   Unilateral primary osteoarthritis, right knee - Kellene received about a week of relief from pain after her steroid injection on 05/23/2023.  She is now having posterior knee pain secondary to her Baker's cyst. - We discussed topical Voltaren gel, daily meloxicam, ice, and stretching - Her temporary orthotics gave her some resolution of knee pain in office today as well. - We discussed the need for weight loss, and strengthening of her vastus medialis.  She will start with straight leg raises at home if she does not have time to make formal physical therapy appointments. - We will follow-up with  her in 4 weeks.  Greater trochanteric pain syndrome of right lower extremity - The patient has weakness of her right hip abductors, as well as a positive Trendelenburg, and tenderness to palpation over the posterior greater trochanter consistent with insertional pain. - She was given printed materials and demonstrated strengthening exercises for her hip abductors. - She can start nightly massage and icing as well. - We will follow-up with her in 4 weeks for reevaluation.  Acquired ankle pronation, unspecified laterality - The patient has pronation that the ankles bilaterally which is likely adding to her knee and hip pain. - Temporary sports orthotic was given today with a medial heel wedge which alleviated her knee pain. - Work note given to allow for composite boots instead of steel toe boots at the patient's request. -We will follow-up with her in 4 weeks.  If desired we can make custom orthotics for her that point.  Potential cost discussed today.   Rica Mote MD [facility] Fellow  Addendum:  Patient seen in the office by fellow.  His history, exam, plan of care were precepted with me.  Norton Blizzard MD Marrianne Mood

## 2023-06-21 NOTE — Assessment & Plan Note (Signed)
-   The patient has weakness of her right hip abductors, as well as a positive Trendelenburg, and tenderness to palpation over the posterior greater trochanter consistent with insertional pain. - She was given printed materials and demonstrated strengthening exercises for her hip abductors. - She can start nightly massage and icing as well. - We will follow-up with her in 4 weeks for reevaluation.

## 2023-06-21 NOTE — Assessment & Plan Note (Signed)
-   Erika Mckenzie received about a week of relief from pain after her steroid injection on 05/23/2023.  She is now having posterior knee pain secondary to her Baker's cyst. - We discussed topical Voltaren gel, daily meloxicam, ice, and stretching - Her temporary orthotics gave her some resolution of knee pain in office today as well. - We discussed the need for weight loss, and strengthening of her vastus medialis.  She will start with straight leg raises at home if she does not have time to make formal physical therapy appointments. - We will follow-up with her in 4 weeks.

## 2023-06-21 NOTE — Assessment & Plan Note (Signed)
-   The patient has pronation that the ankles bilaterally which is likely adding to her knee and hip pain. - Temporary sports orthotic was given today with a medial heel wedge which alleviated her knee pain. - Work note given to allow for composite boots instead of steel toe boots at the patient's request. -We will follow-up with her in 4 weeks.  If desired we can make custom orthotics for her that point.  Potential cost discussed today.

## 2023-07-04 DIAGNOSIS — G8929 Other chronic pain: Secondary | ICD-10-CM | POA: Diagnosis not present

## 2023-07-04 DIAGNOSIS — M25561 Pain in right knee: Secondary | ICD-10-CM | POA: Diagnosis not present

## 2023-07-04 DIAGNOSIS — M1711 Unilateral primary osteoarthritis, right knee: Secondary | ICD-10-CM | POA: Diagnosis not present

## 2023-07-24 ENCOUNTER — Ambulatory Visit: Payer: BC Managed Care – PPO | Admitting: Student

## 2023-07-24 VITALS — BP 142/95 | HR 77 | Ht 63.0 in | Wt 289.4 lb

## 2023-07-24 DIAGNOSIS — Z1211 Encounter for screening for malignant neoplasm of colon: Secondary | ICD-10-CM

## 2023-07-24 DIAGNOSIS — Z1231 Encounter for screening mammogram for malignant neoplasm of breast: Secondary | ICD-10-CM | POA: Diagnosis not present

## 2023-07-24 DIAGNOSIS — Z23 Encounter for immunization: Secondary | ICD-10-CM | POA: Diagnosis not present

## 2023-07-24 DIAGNOSIS — R2241 Localized swelling, mass and lump, right lower limb: Secondary | ICD-10-CM

## 2023-07-24 DIAGNOSIS — Z0000 Encounter for general adult medical examination without abnormal findings: Secondary | ICD-10-CM | POA: Diagnosis not present

## 2023-07-24 LAB — POCT GLYCOSYLATED HEMOGLOBIN (HGB A1C): Hemoglobin A1C: 5.6 % (ref 4.0–5.6)

## 2023-07-24 NOTE — Patient Instructions (Addendum)
It was great to see you today! Thank you for choosing [facility] for your primary care.  Today we addressed: We will get some labs today  We will get an ultrasound as well  Please schedule a follow up with Dr. Raymondo Band at the front for blood pressure in 1-2 weeks   You need a mammogram to prevent breast cancer.  Please schedule an appointment.  You can call [phone].     Schedule your colonoscopy to help detect colon cancer. The stomach doctors will call to schedule an appt with you. If you decide against this, please ask about the cologuard as an option.     If you haven't already, sign up for My Chart to have easy access to your labs results, and communication with your primary care physician. We are checking some labs today. If they are abnormal, I will call you. If they are normal, I will send you a MyChart message (if it is active) or a letter in the mail. If you do not hear about your labs in the next 2 weeks, please call the office. I recommend that you always bring your medications to each appointment as this makes it easy to ensure you are on the correct medications and helps [place] not miss refills when you need them. Call the clinic at [phone] if your symptoms worsen or you have any concerns. Return in about 2 weeks (around 08/07/2023) for BP with Dr. Raymondo Band. Please arrive 15 minutes before your appointment to ensure smooth check in process.  We appreciate your efforts in making this happen.  Thank you for allowing me to participate in your care, Alfredo Martinez, MD 07/24/2023, 2:44 PM PGY-3, [facility]
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
## 2023-07-27 LAB — CBC WITH DIFFERENTIAL/PLATELET
Basophils Absolute: 0 10*3/uL (ref 0.0–0.2)
Basos: 0 %
EOS (ABSOLUTE): 0 10*3/uL (ref 0.0–0.4)
Eos: 1 %
Hematocrit: 39.3 % (ref 34.0–46.6)
Hemoglobin: 13 g/dL (ref 11.1–15.9)
Immature Grans (Abs): 0 10*3/uL (ref 0.0–0.1)
Immature Granulocytes: 0 %
Lymphocytes Absolute: 1.4 10*3/uL (ref 0.7–3.1)
Lymphs: 24 %
MCH: 28 pg (ref 26.6–33.0)
MCHC: 33.1 g/dL (ref 31.5–35.7)
MCV: 85 fL (ref 79–97)
Monocytes Absolute: 0.3 10*3/uL (ref 0.1–0.9)
Monocytes: 6 %
Neutrophils Absolute: 4.1 10*3/uL (ref 1.4–7.0)
Neutrophils: 69 %
Platelets: 159 10*3/uL (ref 150–450)
RBC: 4.65 x10E6/uL (ref 3.77–5.28)
RDW: 13 % (ref 11.7–15.4)
WBC: 6 10*3/uL (ref 3.4–10.8)

## 2023-07-27 LAB — COMPREHENSIVE METABOLIC PANEL
ALT: 23 IU/L (ref 0–32)
AST: 21 IU/L (ref 0–40)
Albumin: 4.1 g/dL (ref 3.8–4.9)
Alkaline Phosphatase: 61 IU/L (ref 44–121)
BUN/Creatinine Ratio: 11 (ref 9–23)
BUN: 10 mg/dL (ref 6–24)
CO2: 20 mmol/L (ref 20–29)
Calcium: 8.7 mg/dL (ref 8.7–10.2)
Chloride: 106 mmol/L (ref 96–106)
Creatinine, Ser: 0.88 mg/dL (ref 0.57–1.00)
Globulin, Total: 2.4 g/dL (ref 1.5–4.5)
Glucose: 86 mg/dL (ref 70–99)
Potassium: 4.2 mmol/L (ref 3.5–5.2)
Sodium: 145 mmol/L — ABNORMAL HIGH (ref 134–144)
Total Protein: 6.5 g/dL (ref 6.0–8.5)
eGFR: 80 mL/min/{1.73_m2} (ref >59)

## 2023-07-27 LAB — LIPID PANEL
Cholesterol, Total: 174 mg/dL (ref 100–199)
HDL: 63 mg/dL (ref >39)
LDL CALC COMMENT:: 2.8 ratio (ref 0.0–4.4)
LDL Chol Calc (NIH): 84 mg/dL (ref 0–99)
Triglycerides: 159 mg/dL — ABNORMAL HIGH (ref 0–149)
VLDL Cholesterol Cal: 27 mg/dL (ref 5–40)

## 2023-07-27 LAB — TSH RFX ON ABNORMAL TO FREE T4: TSH: 0.932 u[IU]/mL (ref 0.450–4.500)

## 2023-07-27 LAB — BRAIN NATRIURETIC PEPTIDE: BNP: 20.4 pg/mL (ref 0.0–100.0)

## 2023-07-29 ENCOUNTER — Other Ambulatory Visit: Payer: Self-pay

## 2023-07-29 ENCOUNTER — Encounter (HOSPITAL_BASED_OUTPATIENT_CLINIC_OR_DEPARTMENT_OTHER): Payer: Self-pay

## 2023-07-29 DIAGNOSIS — M545 Low back pain, unspecified: Secondary | ICD-10-CM | POA: Insufficient documentation

## 2023-07-29 DIAGNOSIS — M5459 Other low back pain: Secondary | ICD-10-CM | POA: Diagnosis not present

## 2023-07-29 LAB — COMPREHENSIVE METABOLIC PANEL
ALT: 39 U/L (ref 0–44)
AST: 23 U/L (ref 15–41)
Albumin: 3.7 g/dL (ref 3.5–5.0)
Alkaline Phosphatase: 51 U/L (ref 38–126)
Anion gap: 10 (ref 5–15)
BUN: 9 mg/dL (ref 6–20)
CO2: 24 mmol/L (ref 22–32)
Calcium: 8.8 mg/dL — ABNORMAL LOW (ref 8.9–10.3)
Chloride: 107 mmol/L (ref 98–111)
Creatinine, Ser: 0.81 mg/dL (ref 0.44–1.00)
GFR, Estimated: >60 mL/min (ref >60)
Glucose, Bld: 126 mg/dL — ABNORMAL HIGH (ref 70–99)
Potassium: 3.6 mmol/L (ref 3.5–5.1)
Sodium: 141 mmol/L (ref 135–145)
Total Bilirubin: 0.3 mg/dL (ref 0.3–1.2)
Total Protein: 6.7 g/dL (ref 6.5–8.1)

## 2023-07-29 LAB — CBC
HCT: 37.9 % (ref 36.0–46.0)
Hemoglobin: 12.6 g/dL (ref 12.0–15.0)
MCH: 27.7 pg (ref 26.0–34.0)
MCHC: 33.2 g/dL (ref 30.0–36.0)
MCV: 83.3 fL (ref 80.0–100.0)
Platelets: 155 10*3/uL (ref 150–400)
RBC: 4.55 MIL/uL (ref 3.87–5.11)
RDW: 13.3 % (ref 11.5–15.5)
WBC: 4.9 10*3/uL (ref 4.0–10.5)
nRBC: 0 % (ref 0.0–0.2)

## 2023-07-29 LAB — LIPASE, BLOOD: Lipase: 40 U/L (ref 11–51)

## 2023-07-29 NOTE — ED Triage Notes (Signed)
Pt presents with R flank pain that has been constant since last night. Pt had one episode ov vomiting last PM. Pt denies any urinary symptoms.

## 2023-07-30 ENCOUNTER — Emergency Department (HOSPITAL_BASED_OUTPATIENT_CLINIC_OR_DEPARTMENT_OTHER): Payer: BC Managed Care – PPO

## 2023-07-30 ENCOUNTER — Emergency Department (HOSPITAL_BASED_OUTPATIENT_CLINIC_OR_DEPARTMENT_OTHER)
Admission: EM | Admit: 2023-07-30 | Discharge: 2023-07-30 | Disposition: A | Discharge status: Home / Self Care | Payer: BC Managed Care – PPO | Attending: Emergency Medicine | Admitting: Emergency Medicine

## 2023-07-30 DIAGNOSIS — M545 Low back pain, unspecified: Secondary | ICD-10-CM

## 2023-07-30 DIAGNOSIS — R932 Abnormal findings on diagnostic imaging of liver and biliary tract: Secondary | ICD-10-CM | POA: Diagnosis not present

## 2023-07-30 DIAGNOSIS — R109 Unspecified abdominal pain: Secondary | ICD-10-CM | POA: Diagnosis not present

## 2023-07-30 DIAGNOSIS — K769 Liver disease, unspecified: Secondary | ICD-10-CM | POA: Diagnosis not present

## 2023-07-30 LAB — URINALYSIS, ROUTINE W REFLEX MICROSCOPIC
Bilirubin Urine: NEGATIVE
Glucose, UA: NEGATIVE mg/dL
Hgb urine dipstick: NEGATIVE
Ketones, ur: NEGATIVE mg/dL
Leukocytes,Ua: NEGATIVE
Nitrite: NEGATIVE
Protein, ur: NEGATIVE mg/dL
Specific Gravity, Urine: 1.01 (ref 1.005–1.030)
pH: 5.5 (ref 5.0–8.0)

## 2023-07-30 MED ORDER — KETOROLAC TROMETHAMINE 30 MG/ML IJ SOLN
30.0000 mg | Freq: Once | INTRAMUSCULAR | Status: DC
  Filled 2023-07-30: qty 1

## 2023-07-30 MED ORDER — HYDROMORPHONE HCL 1 MG/ML IJ SOLN
1.0000 mg | Freq: Once | INTRAMUSCULAR | Status: AC
  Administered 2023-07-30: 1 mg via INTRAVENOUS

## 2023-07-30 MED ORDER — KETOROLAC TROMETHAMINE 15 MG/ML IJ SOLN
15.0000 mg | Freq: Once | INTRAMUSCULAR | Status: AC
  Administered 2023-07-30: 15 mg via INTRAVENOUS
  Filled 2023-07-30: qty 1

## 2023-07-30 MED ORDER — NAPROXEN 500 MG PO TABS: 500.0000 mg | ORAL_TABLET | Freq: Two times a day (BID) | ORAL | 0 refills | Status: DC

## 2023-07-30 MED ORDER — HYDROMORPHONE HCL 1 MG/ML IJ SOLN
1.0000 mg | Freq: Once | INTRAMUSCULAR | Status: DC
  Filled 2023-07-30: qty 1

## 2023-07-30 MED ORDER — METHOCARBAMOL 500 MG PO TABS: 500.0000 mg | ORAL_TABLET | Freq: Three times a day (TID) | ORAL | 0 refills | Status: AC | PRN

## 2023-07-30 NOTE — ED Provider Notes (Signed)
[place] EMERGENCY DEPARTMENT AT [facility] Provider Note   CSN: [id number] Arrival date & time: 07/29/23  2227     History  Chief Complaint  Patient presents with   Flank Pain    Erika Mckenzie is a 52 y.o. female.  Patient with right-sided low back pain since last evening.  Denies any fall or injury.  Denies any lifting injury.  She is never had this kind of pain in the past.  Took Tylenol PM without relief.  No radiation of the pain down her buttock or leg.  No focal weakness, numbness or tingling.  No bowel or bladder incontinence.  No fever or vomiting.  No history of IV drug abuse or cancer.  Denies pain with urination or blood in the urine.  No fever, coughing, chest pain or shortness of breath. She has never had a kidney stone before.  No history of back problems.  Previous cholecystectomy.  Still has appendix.  Pain stays in her right flank and low back area does not radiate around her abdomen does not radiate down her leg.  No fever.  No pain with urination or blood in the urine.  No vaginal bleeding or discharge.  The history is provided by the patient.  Flank Pain Pertinent negatives include no chest pain, no abdominal pain, no headaches and no shortness of breath.       Home Medications Prior to Admission medications   Medication Sig Start Date End Date Taking? Authorizing Provider  amitriptyline (ELAVIL) 10 MG tablet TAKE 1 TABLET(10 MG) BY MOUTH AT BEDTIME 08/17/22   Alfredo Martinez, MD  celecoxib (CELEBREX) 200 MG capsule Take 1 capsule (200 mg total) by mouth 2 (two) times daily between meals as needed. 09/07/22   Kathryne Hitch, MD  cetirizine (ZYRTEC) 10 MG tablet Take 1 tablet (10 mg total) by mouth daily. 04/25/22   Latrelle Dodrill, MD  fluticasone (FLONASE) 50 MCG/ACT nasal spray Place 2 sprays into both nostrils daily. 04/25/22   Latrelle Dodrill, MD  methocarbamol (ROBAXIN) 500 MG tablet Take 1 tablet (500 mg total) by mouth  every 6 (six) hours as needed. 09/07/22   Kathryne Hitch, MD  SUMAtriptan (IMITREX) 50 MG tablet TAKE 1 TABLET BY MOUTH EVERY 2 HOURS AS NEEDED FOR MIGRAINE 01/25/21   Sandre Kitty, MD      Allergies    Tramadol    Review of Systems   Review of Systems  Constitutional:  Negative for activity change, appetite change and fever.  HENT:  Negative for congestion and rhinorrhea.   Respiratory:  Negative for cough, chest tightness and shortness of breath.   Cardiovascular:  Negative for chest pain.  Gastrointestinal:  Negative for abdominal pain, nausea and vomiting.  Genitourinary:  Positive for flank pain. Negative for dysuria and hematuria.  Musculoskeletal:  Positive for back pain. Negative for arthralgias and myalgias.  Skin:  Negative for rash.  Neurological:  Negative for dizziness, weakness and headaches.   all other systems are negative except as noted in the HPI and PMH.    Physical Exam Updated Vital Signs BP (!) 155/82   Pulse (!) 56   Temp 98 F (36.7 C) (Oral)   Resp 20   Ht 5\' 4"  (1.626 m)   Wt 129.7 kg   LMP  (LMP Unknown) Comment: Cont Bleeding since 02/2018  SpO2 98%   BMI 49.09 kg/m  Physical Exam Vitals and nursing note reviewed.  Constitutional:  General: She is not in acute distress.    Appearance: She is well-developed.  HENT:     Head: Normocephalic and atraumatic.     Mouth/Throat:     Pharynx: No oropharyngeal exudate.  Eyes:     Conjunctiva/sclera: Conjunctivae normal.     Pupils: Pupils are equal, round, and reactive to light.  Neck:     Comments: No meningismus. Cardiovascular:     Rate and Rhythm: Normal rate and regular rhythm.     Heart sounds: Normal heart sounds. No murmur heard. Pulmonary:     Effort: Pulmonary effort is normal. No respiratory distress.     Breath sounds: Normal breath sounds.  Abdominal:     Palpations: Abdomen is soft.     Tenderness: There is no abdominal tenderness. There is no guarding or rebound.   Musculoskeletal:        General: Tenderness present. Normal range of motion.     Cervical back: Normal range of motion and neck supple.     Comments: Right paraspinal lumbar tenderness  5/5 strength in bilateral lower extremities. Ankle plantar and dorsiflexion intact. Great toe extension intact bilaterally. +2 DP and PT pulses. Normal gait.   Skin:    General: Skin is warm.  Neurological:     Mental Status: She is alert and oriented to person, place, and time.     Cranial Nerves: No cranial nerve deficit.     Motor: No abnormal muscle tone.     Coordination: Coordination normal.     Comments:  5/5 strength throughout. CN 2-12 intact.Equal grip strength.   Psychiatric:        Behavior: Behavior normal.     ED Results / Procedures / Treatments   Labs (all labs ordered are listed, but only abnormal results are displayed) Labs Reviewed  COMPREHENSIVE METABOLIC PANEL - Abnormal; Notable for the following components:      Result Value   Glucose, Bld 126 (*)    Calcium 8.8 (*)    All other components within normal limits  LIPASE, BLOOD  CBC  URINALYSIS, ROUTINE W REFLEX MICROSCOPIC    EKG None  Radiology CT Renal Stone Study  Result Date: 07/30/2023 CLINICAL DATA:  Right flank pain since last night. Episode of vomiting. EXAM: CT ABDOMEN AND PELVIS WITHOUT CONTRAST TECHNIQUE: Multidetector CT imaging of the abdomen and pelvis was performed following the standard protocol without IV contrast. RADIATION DOSE REDUCTION: This exam was performed according to the departmental dose-optimization program which includes automated exposure control, adjustment of the mA and/or kV according to patient size and/or use of iterative reconstruction technique. COMPARISON:  None Available. FINDINGS: Lower chest:  No contributory findings. Hepatobiliary: 16 mm right lobe liver lesion which is non cystic density. Suspect additional 17 mm lesion in the lateral segment left lobe liver.Cholecystectomy.  No biliary dilatation. Pancreas: Unremarkable. Spleen: Unremarkable. Adrenals/Urinary Tract: Negative adrenals. No hydronephrosis or stone. Unremarkable bladder. Stomach/Bowel:  No obstruction. No visible bowel inflammation Vascular/Lymphatic: No acute vascular abnormality. No mass or adenopathy. Reproductive:Hysterectomy Other: No ascites or pneumoperitoneum. Musculoskeletal: No acute abnormalities. Lucent lesion in the right half of the L5 body with sclerotic rim, benign appearing. The lucent area measures 2.4 cm, no specific internal matrix. IMPRESSION: 1. No specific cause for symptoms. No hydronephrosis or nephrolithiasis. 2. Two non cystic liver lesions measuring up to 17 mm. Recommend follow-up enhanced imaging, liver MRI with contrast would be the most definitive. Electronically Signed   By: Tiburcio Pea M.D.   On: 07/30/2023  06:28    Procedures Procedures    Medications Ordered in ED Medications  HYDROmorphone (DILAUDID) injection 1 mg (has no administration in time range)  ketorolac (TORADOL) 30 MG/ML injection 30 mg (has no administration in time range)    ED Course/ Medical Decision Making/ A&P                                 Medical Decision Making Amount and/or Complexity of Data Reviewed Labs: ordered. Decision-making details documented in ED Course. Radiology: ordered and independent interpretation performed. Decision-making details documented in ED Course. ECG/medicine tests: ordered and independent interpretation performed. Decision-making details documented in ED Course.  Risk Prescription drug management.   Atraumatic right flank pain since last night.  No fall or injury.  Neurovascularly intact distally.  Low suspicion for cord compression or cauda equina.  Testing is reassuring.  Urinalysis is negative.  No evidence of UTI or hematuria.  Consider passed kidney stone versus appendicitis versus musculoskeletal pain.  Pain has improved with treatment in the ED.   Imaging is negative for kidney stone or other acute finding.  No evidence of aortic aneurysm.  Patient made aware of liver cysts and need for follow-up.  Consider passed kidney stone versus musculoskeletal back pain.  No evidence of cord compression or cauda equina.  Will treat symptomatically with anti-inflammatories and muscle relaxers.  Follow-up with PCP.  Return to the ED with worsening pain, weakness, numbness, tingling, bowel or bladder incontinence or any other concerns.        Final Clinical Impression(s) / ED Diagnoses Final diagnoses:  Acute right-sided low back pain without sciatica    Rx / DC Orders ED Discharge Orders     None         Neysa Arts, Jeannett Senior, MD 07/30/23 [phone]

## 2023-07-30 NOTE — ED Notes (Addendum)
Patient given discharge instructions. Questions were answered. Patient verbalized understanding of discharge instructions and care at home.  Discharged with family 

## 2023-07-30 NOTE — Discharge Instructions (Signed)
Your testing is reassuring.  You may have passed a kidney stone.  Take the anti-inflammatories and muscle relaxers as prescribed.  Your CT scan did show that you have some cysts in your liver and you should follow-up with your primary doctor for an MRI for further evaluation of these.  Return the ED with worsening pain, weakness, numbness, tingling, bowel or bladder incontinence, fever or other concerns.

## 2023-07-31 ENCOUNTER — Encounter: Payer: Self-pay | Admitting: Student

## 2023-07-31 ENCOUNTER — Ambulatory Visit (INDEPENDENT_AMBULATORY_CARE_PROVIDER_SITE_OTHER): Payer: BC Managed Care – PPO | Admitting: Student

## 2023-07-31 ENCOUNTER — Other Ambulatory Visit: Payer: Self-pay | Admitting: Student

## 2023-07-31 ENCOUNTER — Other Ambulatory Visit: Payer: Self-pay

## 2023-07-31 VITALS — BP 161/84 | HR 67 | Ht 64.0 in | Wt 285.4 lb

## 2023-07-31 DIAGNOSIS — K769 Liver disease, unspecified: Secondary | ICD-10-CM

## 2023-07-31 DIAGNOSIS — I10 Essential (primary) hypertension: Secondary | ICD-10-CM | POA: Diagnosis not present

## 2023-07-31 DIAGNOSIS — R109 Unspecified abdominal pain: Secondary | ICD-10-CM

## 2023-07-31 MED ORDER — LOSARTAN POTASSIUM-HCTZ 50-12.5 MG PO TABS: 1.0000 | ORAL_TABLET | Freq: Every day | ORAL | 1 refills | Status: DC

## 2023-07-31 NOTE — Assessment & Plan Note (Signed)
Patient comes in for follow-up of flank pain, seen in the ED on 10/20.  Patient had negative CT renal study, and workup. Pain localizes to muscular area of right flank, in lumbar region.  Patient denies any trauma, and appreciates tenderness to palpation, and tenderness with rotation/activation of muscles in that area.  Patient was prescribed naproxen, and robaxin but had not taken the naproxen, for lack of appetite.  Patient encouraged to take naproxen, will trial other topical pain meds as well.  Flank pain is muscular in nature, appears to be some type of strain, no bony tenderness concerning for arthropathy or fracture.  - Naproxen as needed - Lidocaine patches as needed

## 2023-07-31 NOTE — Assessment & Plan Note (Signed)
Patient blood pressure noted to be elevated on multiple visits.  Will initiate blood pressure medicine today.  Given blood pressure in the 150s to 160 systolic range, patient would most likely benefit from combination of 2 antihypertensives.  Will start losartan and HCTZ. - Hyzaar combo pill (losartan 50/HCTZ 12.5) - Follow-up 2 weeks for BP check - BMP at next follow-up

## 2023-07-31 NOTE — Assessment & Plan Note (Signed)
Patient noted to have liver lesions x 2, measuring 17 mm, on CT renal study.  Patient previously screened for hep C and hep B, both negative.  Patient feeling normal state of health, denies any abdominal pain.  Will follow-up liver lesions with MRI, per recommendations of CT renal study. - MRI liver

## 2023-07-31 NOTE — Progress Notes (Signed)
  SUBJECTIVE:   CHIEF COMPLAINT / HPI:   Flank Pain -Seen in ED 10/20 w/ normal UA, CMP, Renal stone study, improved w/ muscle relaxer -Liver lesion x 2 noted, measuring up to 17 mm  Today: Started hurting Friday night, but was able to sleep. Got intense on Saturday and went to ED. Hasn't gotten better, but robaxin allows her to get sleep. Hasn't been eating much so has not taken naproxen. Continues to have side pain.  HTN BP Readings from Last 3 Encounters:  07/31/23 (!) 161/84  07/30/23 124/69  07/24/23 (!) 142/95   Liver Lesions  2 lesions 17 mm long noted on liver. Patient in otherwise normal state of health, denies any abdominal pain.   PERTINENT  PMH / PSH:   OBJECTIVE:  BP (!) 161/84   Pulse 67   Ht 5\' 4"  (1.626 m)   Wt 285 lb 6.4 oz (129.5 kg)   LMP  (LMP Unknown) Comment: Cont Bleeding since 02/2018  SpO2 100%   BMI 48.99 kg/m  Physical Exam Constitutional:      General: She is not in acute distress.    Appearance: Normal appearance. She is not ill-appearing.  Musculoskeletal:     Comments: Right sided flank pain in lumbar area, TTP and worse with bending/stretching/twisting  Neurological:     Mental Status: She is alert.      ASSESSMENT/PLAN:  Flank pain Assessment & Plan: Patient comes in for follow-up of flank pain, seen in the ED on 10/20.  Patient had negative CT renal study, and workup. Pain localizes to muscular area of right flank, in lumbar region.  Patient denies any trauma, and appreciates tenderness to palpation, and tenderness with rotation/activation of muscles in that area.  Patient was prescribed naproxen, and robaxin but had not taken the naproxen, for lack of appetite.  Patient encouraged to take naproxen, will trial other topical pain meds as well.  Flank pain is muscular in nature, appears to be some type of strain, no bony tenderness concerning for arthropathy or fracture.  - Naproxen as needed - Lidocaine patches as needed   Liver  lesion Assessment & Plan: Patient noted to have liver lesions x 2, measuring 17 mm, on CT renal study.  Patient previously screened for hep C and hep B, both negative.  Patient feeling normal state of health, denies any abdominal pain.  Will follow-up liver lesions with MRI, per recommendations of CT renal study. - MRI liver  Orders: -     MR LIVER W WO CONTRAST; Future  Primary hypertension Assessment & Plan: Patient blood pressure noted to be elevated on multiple visits.  Will initiate blood pressure medicine today.  Given blood pressure in the 150s to 160 systolic range, patient would most likely benefit from combination of 2 antihypertensives.  Will start losartan and HCTZ. - Hyzaar combo pill (losartan 50/HCTZ 12.5) - Follow-up 2 weeks for BP check - BMP at next follow-up   Other orders -     Losartan Potassium-HCTZ; Take 1 tablet by mouth daily.  Dispense: 30 tablet; Refill: 1   No follow-ups on file. Bess Kinds, MD 07/31/2023, 5:31 PM PGY-3, [facility]

## 2023-07-31 NOTE — Patient Instructions (Addendum)
It was great to see you! Thank you for allowing me to participate in your care!  I recommend that you always bring your medications to each appointment as this makes it easy to ensure we are on the correct medications and helps [place] not miss when refills are needed.  Our plans for today:  - Flank Pain  Take the Naproxen twice a day and see how it's affecting your pain  Can also try lidocaine patches (salonpas), as needed   Can wear patches for 12 hours at a time    - Liver lesion We are ordering an MRI of your liver, to investigate these liver lesions.  Someone from our office will call you with a date/time/location.   -Liver MRI  - High Blood Pressure  We are starting you on some blood pressure medicine today.   Hyzaar (Hydrochlorothiazide / Losartan combination pill) daily   Follow up in 2 weeks for Blood Pressure check   Take care and seek immediate care sooner if you develop any concerns.   Dr. Bess Kinds, MD [facility]

## 2023-08-01 ENCOUNTER — Encounter: Payer: Self-pay | Admitting: Student

## 2023-08-02 ENCOUNTER — Ambulatory Visit: Payer: BC Managed Care – PPO | Admitting: Family Medicine

## 2023-08-18 ENCOUNTER — Encounter: Payer: Self-pay | Admitting: Student

## 2023-08-18 ENCOUNTER — Ambulatory Visit (HOSPITAL_COMMUNITY)
Admission: RE | Admit: 2023-08-18 | Discharge: 2023-08-18 | Disposition: A | Discharge status: Home / Self Care | Payer: BC Managed Care – PPO | Source: Ambulatory Visit | Attending: Family Medicine

## 2023-08-18 DIAGNOSIS — K769 Liver disease, unspecified: Secondary | ICD-10-CM

## 2023-08-18 DIAGNOSIS — D1803 Hemangioma of intra-abdominal structures: Secondary | ICD-10-CM | POA: Diagnosis not present

## 2023-08-18 DIAGNOSIS — R2241 Localized swelling, mass and lump, right lower limb: Secondary | ICD-10-CM | POA: Diagnosis not present

## 2023-08-18 DIAGNOSIS — N281 Cyst of kidney, acquired: Secondary | ICD-10-CM | POA: Diagnosis not present

## 2023-08-18 DIAGNOSIS — Z9049 Acquired absence of other specified parts of digestive tract: Secondary | ICD-10-CM | POA: Diagnosis not present

## 2023-08-18 MED ORDER — GADOBUTROL 1 MMOL/ML IV SOLN
10.0000 mL | Freq: Once | INTRAVENOUS | Status: AC | PRN
  Administered 2023-08-18: 10 mL via INTRAVENOUS

## 2023-09-05 ENCOUNTER — Ambulatory Visit (INDEPENDENT_AMBULATORY_CARE_PROVIDER_SITE_OTHER): Payer: BC Managed Care – PPO | Admitting: Family Medicine

## 2023-09-05 ENCOUNTER — Other Ambulatory Visit: Payer: Self-pay

## 2023-09-05 ENCOUNTER — Encounter: Payer: Self-pay | Admitting: Family Medicine

## 2023-09-05 VITALS — BP 138/87 | Ht 64.0 in | Wt 286.0 lb

## 2023-09-05 DIAGNOSIS — S83281D Other tear of lateral meniscus, current injury, right knee, subsequent encounter: Secondary | ICD-10-CM | POA: Diagnosis not present

## 2023-09-05 DIAGNOSIS — M25561 Pain in right knee: Secondary | ICD-10-CM | POA: Diagnosis not present

## 2023-09-05 DIAGNOSIS — G8929 Other chronic pain: Secondary | ICD-10-CM | POA: Diagnosis not present

## 2023-09-05 DIAGNOSIS — M1711 Unilateral primary osteoarthritis, right knee: Secondary | ICD-10-CM | POA: Diagnosis not present

## 2023-09-05 MED ORDER — KETOROLAC TROMETHAMINE 60 MG/2ML IM SOLN
30.0000 mg | Freq: Once | INTRAMUSCULAR | Status: AC
  Administered 2023-09-05: 30 mg via INTRA_ARTICULAR

## 2023-09-05 NOTE — Progress Notes (Signed)
PCP: Alfredo Martinez, MD  Chief Complaint: Right knee pain Subjective:   HPI: Patient is a 52 y.o. female here for right knee pain. Patient had a steroid injection back in August and states that she got approximately 2 weeks of relief from that.  Patient states that her knee has been bothering her and that she is having difficulty extending and flexing it.  More so with flexing at the and extending.  Patient states that her knee is hurting all the time and she is now in a shift at her work where she is on the floor a lot and having to walk.  Patient otherwise has no other concerns at this time, denies any recent trauma to the knee or any injuries.  Patient does feel like the knee is swollen Past Medical History:  Diagnosis Date   Anemia    GERD (gastroesophageal reflux disease)    otc med prn   Migraines    Seasonal allergies    SVD (spontaneous vaginal delivery)    x 2    Current Outpatient Medications on File Prior to Visit  Medication Sig Dispense Refill   amitriptyline (ELAVIL) 10 MG tablet TAKE 1 TABLET(10 MG) BY MOUTH AT BEDTIME 30 tablet 1   celecoxib (CELEBREX) 200 MG capsule Take 1 capsule (200 mg total) by mouth 2 (two) times daily between meals as needed. 60 capsule 3   cetirizine (ZYRTEC) 10 MG tablet Take 1 tablet (10 mg total) by mouth daily. 30 tablet 3   fluticasone (FLONASE) 50 MCG/ACT nasal spray Place 2 sprays into both nostrils daily. 16 g 6   losartan-hydrochlorothiazide (HYZAAR) 50-12.5 MG tablet TAKE 1 TABLET BY MOUTH DAILY 90 tablet 0   methocarbamol (ROBAXIN) 500 MG tablet Take 1 tablet (500 mg total) by mouth every 8 (eight) hours as needed for muscle spasms. 20 tablet 0   naproxen (NAPROSYN) 500 MG tablet Take 1 tablet (500 mg total) by mouth 2 (two) times daily with a meal. 30 tablet 0   SUMAtriptan (IMITREX) 50 MG tablet TAKE 1 TABLET BY MOUTH EVERY 2 HOURS AS NEEDED FOR MIGRAINE 30 tablet 3   No current facility-administered medications on file prior to  visit.    Past Surgical History:  Procedure Laterality Date   CHOLECYSTECTOMY     DILATION AND CURETTAGE OF UTERUS     MAB   TONSILLECTOMY     TUBAL LIGATION     VAGINAL HYSTERECTOMY Bilateral 06/19/2018   Procedure: HYSTERECTOMY VAGINAL WITH  Right SALPINGECTOMY;  Surgeon: Allie Bossier, MD;  Location: [facility];  Service: Gynecology;  Laterality: Bilateral;   WISDOM TOOTH EXTRACTION      Allergies  Allergen Reactions   Tramadol Hives    BP 138/87   Ht 5\' 4"  (1.626 m)   Wt 286 lb (129.7 kg)   LMP  (LMP Unknown) Comment: Cont Bleeding since 02/2018  BMI 49.09 kg/m       No data to display              No data to display              Objective:  Physical Exam:  Gen: NAD, comfortable in exam room  Right knee: There is some swelling of the right knee when compared to the left, no erythema, warmth or any bony abnormalities noted.  There is some tenderness to palpation over the lateral joint lines, also over the lateral border of the patella.  Range of motion is decreased with  flexion due to resistance/pain.  Full range of motion with knee extension.  Strength is approximately 5 out of 5 though pain noted with active flexion and extension.  IMAGING: RT KNEE XR AP/LAT/SUNRISE + B/L STANDING AP from 08/09/22: - images personally reviewed and interpreted by Brenton Grills, MD and Darene Lamer, DO - tricompartmental OA with moderate OA in the right lateral compartment and moderate OA in the left medial compartment  MRI RT KNEE 08/29/22 showing: IMPRESSION: 1. Tricompartmental degenerative changes, most advanced in the lateral compartment. No acute osseous findings. 2. Lateral meniscal degeneration with mild peripheral extrusion from the joint space. No discrete meniscal tear or displaced meniscal fragment. 3. ACL mucoid degeneration. No acute ligamentous findings. 4. Small joint effusion and small Baker's cyst.   Assessment & Plan:  1. 1. Chronic pain of right knee with  moderate OA and lateral meniscus tear - Acute on chronic knee pain with acute exacerbation related to MRI pathology which showed a lateral meniscal degenerative tear as well as some mild/moderate osteoarthritic changes.   - Tx options discussed.  At this time, we will go ahead and drain patient's knee I, as moderate effusion found on ultrasound.  Patient was able to tolerate the procedure well and approximately 30 cc of yellow fluid was drained.  Patient also received intra-articular Toradol injection. Patient noted significant improvement after the aspiration injection.  See 'Procedure Note' below -Will also go ahead and have patient do formal physical therapy at this time, discussed with patient options regarding her knee pathology and the goal will be to push off knee surgery as much as possible given that is the mainstay of treatment for this.  Will also have patient reach out when she is ready to try a gel injection into the area.  Patient is understanding and agreeable with plan. - [place] LIMITED JOINT SPACE STRUCTURES LOW RIGHT; Future - Ambulatory referral to Physical Therapy   PROCEDURE:  Risks & benefits of right knee u/s guided aspiration & toradol injection reviewed. Consent obtained. Time-out completed. Patient prepped and draped in the normal fashion. Area cleansed with chlorhexidene. Ethyl chloride spray used to anesthetize the skin.  MSK used to identify appropriate anatomy - moderate effusion noted.  Solution of 5 mL 1% lidocaine injected under ultrasound guidance into superior lateral aspect of  knee for local anesthesia.  After ensuring adequate anesthesia an 18-g 1.5-inch needle on 60-mL syringe inserted into right knee via superior-lateral approach under ultrasound guidance.  Aspirated 30 cc of yellow synovial fluid - was not sent for analysis.  Needle left in place, syringe exchanged for syringe with solution of 3 mL 1% lidocaine with 1 mL toradol  60mg /mL.  This was injected into the right  knee without complication. Patient tolerated procedure well without any complications. Area covered with adhesive bandage. Post-procedure care reviewed.  All questions answered.   Brenton Grills MD, PGY-4  Sports Medicine Fellow [facility]  Addendum:  Patient seen and examined in the office with fellow.   History, exam, plan of care were precepted with me.  I was present and assisted with u/s guided procedure.  Agree with findings as documented in fellow note with additions as noted above.  Darene Lamer, DO, CAQSM

## 2023-09-05 NOTE — Patient Instructions (Signed)

## 2023-09-28 ENCOUNTER — Telehealth: Payer: Self-pay

## 2023-09-28 NOTE — Telephone Encounter (Signed)
Patient LVM on nurse line regarding migraine. Attempted to call patient back. Patient picked up phone, however, unable to hear patient speaking.   Will attempt to call back.   Veronda Prude, RN

## 2024-03-19 ENCOUNTER — Encounter: Payer: Self-pay | Admitting: *Deleted

## 2024-07-03 ENCOUNTER — Ambulatory Visit (INDEPENDENT_AMBULATORY_CARE_PROVIDER_SITE_OTHER): Payer: Self-pay | Admitting: Internal Medicine

## 2024-07-03 VITALS — BP 149/95 | Ht 65.0 in | Wt 280.0 lb

## 2024-07-03 DIAGNOSIS — M25562 Pain in left knee: Secondary | ICD-10-CM

## 2024-07-03 DIAGNOSIS — G8929 Other chronic pain: Secondary | ICD-10-CM | POA: Diagnosis not present

## 2024-07-03 DIAGNOSIS — M25561 Pain in right knee: Secondary | ICD-10-CM | POA: Diagnosis not present

## 2024-07-03 MED ORDER — MELOXICAM 15 MG PO TABS: 15.0000 mg | ORAL_TABLET | Freq: Every day | ORAL | 0 refills | Status: DC

## 2024-07-03 NOTE — Progress Notes (Unsigned)
 PCP: [name], MD  Subjective:   HPI: Patient is a 53 y.o. female here for chronic bilateral knee pain. 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 She has tried steroid injections previously without sustained pain relief. Currently managing pain with Tylenol . Night pain has recently worsened and wakes her from sleep. She would like to discuss options for improved pain control and she is also interested in trying a body helix for each knee. Currently using [employer] compression sleeves for each knee but experienced discomfort when she tried pulling them above her knees .She reports pain and stiffness bilaterally, worse with ambulation and standing. Symptoms are generally worse in the right knee but have gradually worsened in the left knee over the last few months.   Past Medical History:  Diagnosis Date   Anemia    GERD (gastroesophageal reflux disease)    otc med prn   Migraines    Seasonal allergies    SVD (spontaneous vaginal delivery)    x 2    Current Outpatient Medications on File Prior to Visit  Medication Sig Dispense Refill   amitriptyline  (ELAVIL ) 10 MG tablet TAKE 1 TABLET(10 MG) BY MOUTH AT BEDTIME 30 tablet 1   celecoxib  (CELEBREX ) 200 MG capsule Take 1 capsule (200 mg total) by mouth 2 (two) times daily between meals as needed. 60 capsule 3   cetirizine  (ZYRTEC ) 10 MG tablet Take 1 tablet (10 mg total) by mouth daily. 30 tablet 3   fluticasone  (FLONASE ) 50 MCG/ACT nasal spray Place 2 sprays into both nostrils daily. 16 g 6   losartan -hydrochlorothiazide (HYZAAR) 50-12.5 MG tablet TAKE 1 TABLET BY MOUTH DAILY 90 tablet 0   methocarbamol  (ROBAXIN ) 500 MG tablet Take 1 tablet (500 mg total) by mouth every 8 (eight) hours as needed for muscle spasms. 20 tablet 0   naproxen  (NAPROSYN ) 500 MG tablet Take 1 tablet (500 mg total) by mouth 2 (two) times daily with a meal. 30 tablet 0    SUMAtriptan  (IMITREX ) 50 MG tablet TAKE 1 TABLET BY MOUTH EVERY 2 HOURS AS NEEDED FOR MIGRAINE 30 tablet 3   No current facility-administered medications on file prior to visit.   Past Surgical History:  Procedure Laterality Date   CHOLECYSTECTOMY     DILATION AND CURETTAGE OF UTERUS     MAB   TONSILLECTOMY     TUBAL LIGATION     VAGINAL HYSTERECTOMY Bilateral 06/19/2018   Procedure: HYSTERECTOMY VAGINAL WITH  Right SALPINGECTOMY;  Surgeon: Starla Harland BROCKS, MD;  Location: [facility];  Service: Gynecology;  Laterality: Bilateral;   WISDOM TOOTH EXTRACTION      Allergies  Allergen Reactions   Tramadol  Hives    BP (!) 149/95   Ht 5' 5 (1.651 m)   Wt 280 lb (127 kg)   LMP  (LMP Unknown) Comment: Cont Bleeding since 02/2018  BMI 46.59 kg/m       No data to display              No data to display              Objective:  Physical Exam:  Gen: NAD, comfortable in exam room  Bilateral Knees No gross deformity, ecchymoses, swelling. No TTP ROM from 5-110 degrees flexion bilaterally.There is crepitus with extension of the left knee.  Negative ant/post drawers. Negative valgus/varus testing. Negative lachman.  Negative mcmurrays NV intact distally.    Assessment & Plan:  1. Acute on Chronic Bilateral Knee Pain Know degenerative changes in the right knee. Pain in the left knee has worsened in recent months. Steroid injections have not provided sustained pain relief. She is not a surgical candidate. Presenting today largely because of poorly controlled night pain.   -Recommend using meloxicam  15 mg nightly as needed for pain relief. She was instructed to take it nightly for the next 10-14 days and then attempt to use only as needed -We discussed body helix compression sleeves for each knee today but unfortunately they are cost prohibitive at the moment -Follow up in 4 weeks for reassessment. Can discuss additional non-surgical options for sustained pain control. She may be a  candidate for geniculate nerve block.

## 2024-07-03 NOTE — Patient Instructions (Signed)
 It was a pleasure to see you today.  Thank you for giving us  the opportunity to be involved in your care.  Below is a brief recap of your visit and next steps.  We will plan to see you again in 4 weeks.  Summary I prescribed meloxicam  15 mg nightly for pain relief. Take once daily for the next 10-14 days then use as needed. We will try to fit you for body helices today. Follow up in 4 weeks for reassessment.

## 2024-07-04 ENCOUNTER — Encounter: Payer: Self-pay | Admitting: Internal Medicine

## 2024-07-31 ENCOUNTER — Encounter: Payer: Self-pay | Admitting: Internal Medicine

## 2024-07-31 ENCOUNTER — Ambulatory Visit: Admitting: Internal Medicine

## 2024-07-31 DIAGNOSIS — G8929 Other chronic pain: Secondary | ICD-10-CM

## 2024-07-31 DIAGNOSIS — M25562 Pain in left knee: Secondary | ICD-10-CM | POA: Diagnosis not present

## 2024-07-31 DIAGNOSIS — M25561 Pain in right knee: Secondary | ICD-10-CM | POA: Diagnosis not present

## 2024-07-31 MED ORDER — MELOXICAM 15 MG PO TABS: 15.0000 mg | ORAL_TABLET | Freq: Every day | ORAL | 1 refills | Status: AC

## 2024-07-31 NOTE — Progress Notes (Cosign Needed Addendum)
 PCP: [name], MD  Patient is a 53 y.o. female here for follow-up of chronic bilateral knee pain.  Known history of advanced tricompartmental osteoarthritis of the right knee with lateral meniscal degeneration on imaging from 2023.  She was previously evaluated by me on 9/24 at which time she reported worsening night pain.  Meloxicam  was added nightly as needed for pain relief.  We discussed compression knee sleeves as well.  4-week follow-up was arranged for reassessment.  Today she states that her pain is approximately 50% improved with daily use of meloxicam .  She continues to use compression sleeves and feels that she can walk further without discomfort.   Past Medical History:  Diagnosis Date   Anemia    GERD (gastroesophageal reflux disease)    otc med prn   Migraines    Seasonal allergies    SVD (spontaneous vaginal delivery)    x 2    Current Outpatient Medications on File Prior to Visit  Medication Sig Dispense Refill   amitriptyline  (ELAVIL ) 10 MG tablet TAKE 1 TABLET(10 MG) BY MOUTH AT BEDTIME 30 tablet 1   celecoxib  (CELEBREX ) 200 MG capsule Take 1 capsule (200 mg total) by mouth 2 (two) times daily between meals as needed. 60 capsule 3   cetirizine  (ZYRTEC ) 10 MG tablet Take 1 tablet (10 mg total) by mouth daily. 30 tablet 3   fluticasone  (FLONASE ) 50 MCG/ACT nasal spray Place 2 sprays into both nostrils daily. 16 g 6   losartan -hydrochlorothiazide (HYZAAR) 50-12.5 MG tablet TAKE 1 TABLET BY MOUTH DAILY 90 tablet 0   methocarbamol  (ROBAXIN ) 500 MG tablet Take 1 tablet (500 mg total) by mouth every 8 (eight) hours as needed for muscle spasms. 20 tablet 0   SUMAtriptan  (IMITREX ) 50 MG tablet TAKE 1 TABLET BY MOUTH EVERY 2 HOURS AS NEEDED FOR MIGRAINE 30 tablet 3   No current facility-administered medications on file prior to visit.    Past Surgical History:  Procedure Laterality Date   CHOLECYSTECTOMY     DILATION AND CURETTAGE OF UTERUS     MAB   TONSILLECTOMY      TUBAL LIGATION     VAGINAL HYSTERECTOMY Bilateral 06/19/2018   Procedure: HYSTERECTOMY VAGINAL WITH  Right SALPINGECTOMY;  Surgeon: Starla Harland BROCKS, MD;  Location: [facility];  Service: Gynecology;  Laterality: Bilateral;   WISDOM TOOTH EXTRACTION      Allergies  Allergen Reactions   Tramadol  Hives    BP (!) 141/84   Ht 5' 5 (1.651 m)   Wt 270 lb (122.5 kg)   LMP  (LMP Unknown) Comment: Cont Bleeding since 02/2018  BMI 44.93 kg/m       No data to display              No data to display              Objective:  Physical Exam:  Gen: NAD, comfortable in exam room  Bilateral knees No gross deformity, ecchymoses, swelling. No TTP ROM from 5-115 degrees of flexion bilaterally.  Crepitus with extension of both knees. Negative ant/post drawers. Negative valgus/varus testing. Negative lachman.  Negative mcmurrays, apleys.  NV intact distally.   Assessment and Plan:  Chronic bilateral knee pain Improved with daily use of meloxicam .  I recommended that she attempt to reduce the frequency to as needed given concern detrimental effects of chronic NSAID use.  She can continue as needed use of Tylenol  for breakthrough pain.  She should continue using compression knee sleeves.  She has been given a home exercise program at her request.  We also discussed genicular nerve block, which she will consider if pain acutely worsens.  At this point she will follow-up with sports medicine on an as-needed basis.
